# Patient Record
Sex: Female | Born: 2000 | Race: White | Hispanic: No | Marital: Single | Smoking: Never smoker
Health system: Southern US, Community
[De-identification: ages and names within clinical notes are randomized; demographics above are authoritative.]

---
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
## 2022-06-18 DIAGNOSIS — F331 Major depressive disorder, recurrent, moderate: Secondary | ICD-10-CM | POA: Diagnosis not present

## 2022-06-21 DIAGNOSIS — F988 Other specified behavioral and emotional disorders with onset usually occurring in childhood and adolescence: Secondary | ICD-10-CM | POA: Diagnosis not present

## 2022-06-28 DIAGNOSIS — F331 Major depressive disorder, recurrent, moderate: Secondary | ICD-10-CM | POA: Diagnosis not present

## 2022-07-04 DIAGNOSIS — F4011 Social phobia, generalized: Secondary | ICD-10-CM | POA: Diagnosis not present

## 2022-07-04 DIAGNOSIS — F5105 Insomnia due to other mental disorder: Secondary | ICD-10-CM | POA: Diagnosis not present

## 2022-07-04 DIAGNOSIS — F902 Attention-deficit hyperactivity disorder, combined type: Secondary | ICD-10-CM | POA: Diagnosis not present

## 2022-07-04 DIAGNOSIS — F411 Generalized anxiety disorder: Secondary | ICD-10-CM | POA: Diagnosis not present

## 2022-07-04 DIAGNOSIS — F330 Major depressive disorder, recurrent, mild: Secondary | ICD-10-CM | POA: Diagnosis not present

## 2022-07-16 DIAGNOSIS — F331 Major depressive disorder, recurrent, moderate: Secondary | ICD-10-CM | POA: Diagnosis not present

## 2022-07-17 DIAGNOSIS — F4011 Social phobia, generalized: Secondary | ICD-10-CM | POA: Diagnosis not present

## 2022-07-17 DIAGNOSIS — F330 Major depressive disorder, recurrent, mild: Secondary | ICD-10-CM | POA: Diagnosis not present

## 2022-07-17 DIAGNOSIS — F902 Attention-deficit hyperactivity disorder, combined type: Secondary | ICD-10-CM | POA: Diagnosis not present

## 2022-07-17 DIAGNOSIS — F5105 Insomnia due to other mental disorder: Secondary | ICD-10-CM | POA: Diagnosis not present

## 2022-08-07 DIAGNOSIS — F331 Major depressive disorder, recurrent, moderate: Secondary | ICD-10-CM | POA: Diagnosis not present

## 2022-08-14 DIAGNOSIS — F902 Attention-deficit hyperactivity disorder, combined type: Secondary | ICD-10-CM | POA: Diagnosis not present

## 2022-08-14 DIAGNOSIS — F4011 Social phobia, generalized: Secondary | ICD-10-CM | POA: Diagnosis not present

## 2022-08-14 DIAGNOSIS — F330 Major depressive disorder, recurrent, mild: Secondary | ICD-10-CM | POA: Diagnosis not present

## 2022-08-14 DIAGNOSIS — F5105 Insomnia due to other mental disorder: Secondary | ICD-10-CM | POA: Diagnosis not present

## 2022-08-30 ENCOUNTER — Ambulatory Visit: Payer: BC Managed Care – PPO | Admitting: Cardiology

## 2022-08-30 DIAGNOSIS — Z03818 Encounter for observation for suspected exposure to other biological agents ruled out: Secondary | ICD-10-CM | POA: Diagnosis not present

## 2022-08-30 DIAGNOSIS — R6889 Other general symptoms and signs: Secondary | ICD-10-CM | POA: Diagnosis not present

## 2022-09-04 DIAGNOSIS — F331 Major depressive disorder, recurrent, moderate: Secondary | ICD-10-CM | POA: Diagnosis not present

## 2022-09-10 DIAGNOSIS — R002 Palpitations: Secondary | ICD-10-CM | POA: Insufficient documentation

## 2022-09-10 NOTE — Progress Notes (Unsigned)
Cardiology Office Note  Date:  09/11/2022   ID:  Caragh, Gasper 25-Mar-2001, MRN [id number]  PCP:  Kassie Mends, MD   Chief Complaint  Patient presents with   New Patient (Initial Visit)    Ref by Dr. Nicolasa Ducking for tachycardia and fluttering in chest. Patient is taking Vyvanse and needs to discuss taking this with having tachycardia. Medications reviewed by the patient verblaly.     HPI:  [name] is a 21 year old woman with past medical history of ADD/fatigue Who presents by referral from Dr. 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 the heat When she gets too hot or exercises and gets overheated, will appreciate faster heartbeat, fluttering  Rare heart rate issues at rest, worse with stress Happening less now, has changed living situation Better since moving dorms, has single room, 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  PSH:    Past Surgical History:  Procedure Laterality Date   APPENDECTOMY  09/01/2017   LAPAROSCOPIC APPENDECTOMY N/A 09/01/2017   Procedure: APPENDECTOMY LAPAROSCOPIC;  Surgeon: Stanford Scotland, MD;  Location: [place];  Service: General;  Laterality: N/A;    Current Outpatient Medications   Medication Sig Dispense Refill   albuterol (VENTOLIN HFA) 108 (90 Base) MCG/ACT inhaler Inhale into the lungs.     lisdexamfetamine (VYVANSE) 50 MG capsule Take 50 mg by mouth daily.     sertraline (ZOLOFT) 50 MG tablet Take by mouth.     lamoTRIgine (LAMICTAL) 25 MG tablet Take 1 tablet (25 mg total) by mouth daily. (Patient not taking: Reported on 09/11/2022) 30 tablet 0   VYVANSE 40 MG capsule Take 40 mg by mouth every morning. (Patient not taking: Reported on 09/11/2022)  0   No current facility-administered medications for this visit.     Allergies:   Amoxicillin-pot clavulanate and Cat hair extract   Social History:  The patient  reports that she has never smoked. She has never used smokeless tobacco. She reports that she does not drink alcohol and does not use drugs.   Family History:   family history includes Heart disease (age of onset: 38) in her father; Hyperlipidemia in her mother; Hypertension in her mother.    Review of Systems: Review of Systems  Constitutional: Negative.   HENT: Negative.    Respiratory: Negative.    Cardiovascular: Negative.   Gastrointestinal: Negative.   Musculoskeletal: Negative.   Neurological: Negative.   Psychiatric/Behavioral: Negative.    All other systems reviewed and are negative.   PHYSICAL EXAM: VS:  BP 110/72 (BP Location: Left Arm, Patient Position: Sitting, Cuff Size: Normal)   Pulse 82   Ht 5' (1.524 m)   Wt 190 lb 4 oz (86.3 kg)   SpO2 99%  BMI 37.16 kg/m  , BMI Body mass index is 37.16 kg/m. GEN: Well nourished, well developed, in no acute distress HEENT: normal Neck: no JVD, carotid bruits, or masses Cardiac: RRR; no murmurs, rubs, or gallops,no edema  Respiratory:  clear to auscultation bilaterally, normal work of breathing GI: soft, nontender, nondistended, + BS MS: no deformity or atrophy Skin: warm and dry, no rash Neuro:  Strength and sensation are intact Psych: euthymic mood, full affect   Recent Labs: No  results found for requested labs within last 365 days.    Lipid Panel Lab Results  Component Value Date   CHOL 130 11/23/2017   HDL 41 11/23/2017   LDLCALC 79 11/23/2017   TRIG 51 11/23/2017      Wt Readings from Last 3 Encounters:  09/11/22 190 lb 4 oz (86.3 kg)  05/24/19 135 lb 11.2 oz (61.6 kg) (74 %, Z= 0.64)*  11/20/17 133 lb (60.3 kg) (76 %, Z= 0.71)*   * Growth percentiles are based on CDC (Girls, 2-20 Years) data.       ASSESSMENT AND PLAN:  Problem List Items Addressed This Visit     Palpitations   Relevant Orders   EKG 12-Lead   LONG TERM MONITOR (3-14 DAYS)   ADD (attention deficit disorder) without hyperactivity   Relevant Orders   EKG 12-Lead   Other Visit Diagnoses     Paroxysmal tachycardia (HCC)    -  Primary   Relevant Orders   EKG 12-Lead   LONG TERM MONITOR (3-14 DAYS)      Paroxysmal tachycardia/palpitations Likely situational in the setting of getting overheated with band camp while practicing in the heat of the day Also seems to present with she gets overheated with exercise Symptoms dramatically improved after changing her living situation and stopping and practice in the heat We have recommended she wear a Zio monitor to rule out arrhythmia Discussed potentially using short acting beta-blocker such as propranolol as needed for any breakthrough tachypalpitations not associated with stressful situations or getting overheated.  She will call us if she has such symptoms requiring the prescription Normal clinical exam, normal EKG For now recommended she stay on her ADD medication which she has been on for 10 years.  No cardiac contraindication   Total encounter time more than 60 minutes  Greater than 50% was spent in counseling and coordination of care with the patient  Patient seen in consultation for Dr. Maryruth Bun who referred back to her office for ongoing care of the issues detailed above  Signed, Dossie Arbour, M.D., Ph.D. [facility] [place], [place] [phone]

## 2022-09-11 ENCOUNTER — Ambulatory Visit: Payer: BC Managed Care – PPO | Attending: Cardiology | Admitting: Cardiovascular Disease

## 2022-09-11 ENCOUNTER — Encounter: Payer: Self-pay | Admitting: Cardiovascular Disease

## 2022-09-11 ENCOUNTER — Ambulatory Visit: Payer: BC Managed Care – PPO

## 2022-09-11 VITALS — BP 110/72 | HR 82 | Ht 60.0 in | Wt 190.2 lb

## 2022-09-11 DIAGNOSIS — R002 Palpitations: Secondary | ICD-10-CM

## 2022-09-11 DIAGNOSIS — I479 Paroxysmal tachycardia, unspecified: Secondary | ICD-10-CM

## 2022-09-11 DIAGNOSIS — F988 Other specified behavioral and emotional disorders with onset usually occurring in childhood and adolescence: Secondary | ICD-10-CM

## 2022-09-11 NOTE — Patient Instructions (Addendum)
Medication Instructions:  No changes  If you need a refill on your cardiac medications before your next appointment, please call your pharmacy.   Lab work: No new labs needed  Testing/Procedures:  ZIO XT- Long Term Monitor Instructions  Your physician has requested you wear a ZIO patch monitor for 14 days.  This is a single patch monitor. [employer] supplies one patch monitor per enrollment. Additional stickers are not available. Please do not apply patch if you will be having a Nuclear Stress Test,  Echocardiogram, Cardiac CT, MRI, or Chest Xray during the period you would be wearing the  monitor. The patch cannot be worn during these tests. You cannot remove and re-apply the  ZIO XT patch monitor.  Your ZIO patch monitor will be mailed 3 day USPS to your address on file. It may take 3-5 days  to receive your monitor after you have been enrolled.  Once you have received your monitor, please review the enclosed instructions. Your monitor  has already been registered assigning a specific monitor serial # to you.  Billing and Patient Assistance Program Information  We have supplied [employer] with any of your insurance information on file for billing purposes. [employer] offers a sliding scale Patient Assistance Program for patients that do not have  insurance, or whose insurance does not completely cover the cost of the ZIO monitor.  You must apply for the Patient Assistance Program to qualify for this discounted rate.  To apply, please call [employer] at [phone], select option 4, select option 2, ask to apply for  Patient Assistance Program. Meredeth Ide will ask your household income, and how many people  are in your household. They will quote your out-of-pocket cost based on that information.  [employer] will also be able to set up a 31-month, interest-free payment plan if needed.  Applying the monitor   Shave hair from upper left chest.  Hold abrader disc by orange tab. Rub abrader in 40  strokes over the upper left chest as  indicated in your monitor instructions.  Clean area with 4 enclosed alcohol pads. Let dry.  Apply patch as indicated in monitor instructions. Patch will be placed under collarbone on left  side of chest with arrow pointing upward.  Rub patch adhesive wings for 2 minutes. Remove white label marked "1". Remove the white  label marked "2". Rub patch adhesive wings for 2 additional minutes.  While looking in a mirror, press and release button in center of patch. A small green light will  flash 3-4 times. This will be your only indicator that the monitor has been turned on.  Do not shower for the first 24 hours. You may shower after the first 24 hours.  Press the button if you feel a symptom. You will hear a small click. Record Date, Time and  Symptom in the Patient Logbook.  When you are ready to remove the patch, follow instructions on the last 2 pages of Patient  Logbook. Stick patch monitor onto the last page of Patient Logbook.  Place Patient Logbook in the blue and white box. Use locking tab on box and tape box closed  securely. The blue and white box has prepaid postage on it. Please place it in the mailbox as  soon as possible. Your physician should have your test results approximately 7 days after the  monitor has been mailed back to [facility].  Call [facility] Customer Care at [phone] if you have questions regarding  your ZIO XT patch  monitor. Call them immediately if you see an orange light blinking on your  monitor.  If your monitor falls off in less than 4 days, contact our Monitor department at [phone].  If your monitor becomes loose or falls off after 4 days call [employer] at [phone] for  suggestions on securing your monitor   Follow-Up: At [facility], you and your health needs are our priority.  As part of our continuing mission to provide you with exceptional heart care, we have created designated Provider Care  Teams.  These Care Teams include your primary Cardiologist (physician) and Advanced Practice Providers (APPs -  Physician Assistants and Nurse Practitioners) who all work together to provide you with the care you need, when you need it.  You will need a follow up appointment as needed  Providers on your designated Care Team:   Murray Hodgkins, NP Christell Faith, PA-C Cadence Kathlen Mody, [place]  COVID-19 Vaccine Information can be found at: [email or web address].uk For questions related to vaccine distribution or appointments, please email vaccine@ [email or web address] or call [phone].

## 2022-09-12 ENCOUNTER — Telehealth: Payer: Self-pay | Admitting: Cardiovascular Disease

## 2022-09-12 NOTE — Telephone Encounter (Signed)
Pt mother calling stating she cannot afford heart monitor and does not want it to be sent out.

## 2022-09-12 NOTE — Telephone Encounter (Signed)
Spoke with the patient's mother who states that the heart monitor is going to be difficult for them to afford. She states that she would like to know whether Dr. Rockey Situ thinks that it is necessary for her daughter to wear the monitor. She states that she understood that part of the reason for patient's cardiac evaluation was to be cleared by Dr. Nicolasa Ducking who is prescribing the patient Vyvanse. Mother is very tearful and concerned. She states that if Dr. Rockey Situ thinks that the heart monitor is necessary that they will figure out a way to pay for it because she would hate for something to be wrong with her daughter and they not know because of not going through with the heart monitor. Mother just wants to make sure that Dr. Rockey Situ would be ordering the heart monitor regardless of request from Dr. Nicolasa Ducking or not.   I have also reassured her that they will not be charged for the monitor until it is worn so if it is received in the mail to hold onto it until we hear back from Dr. Rockey Situ. She verbalized understanding.

## 2022-09-14 NOTE — Telephone Encounter (Signed)
Cassandra Merritts, MD  P Cv Div [place] Triage Caller: Unspecified (2 days ago,  5:67 AM) It is certainly not mandatory that she wear a Zio monitor  It is encouraging she has been feeling well recently with no significant symptoms of tachycardia  Prior symptoms most likely sinus tachycardia in the setting of stress such as getting overheated  An alternate way that she could monitor for cardiac arrhythmia would be purchase a cheaper device such as a cardio mobile device  [email or web address].tn  When having symptoms could record a EKG rhythm strip and send [place] the data through [place]

## 2022-09-14 NOTE — Telephone Encounter (Signed)
Spoke with the patients mother and made her aware of Dr. Donivan Scull recommendation.  Patient's mother sts that they will purchase the recommended Kardiamobile device and have the patient keep it with her to record symptoms when they happen and signed the EKG throught mychart for Dr. Candis Musa to review. Patient is not currently signed up with mychart. Mychart sign up link sent to the pt mobile number provided by the pt mother [phone].  Adv the pt mother that if the zio is received in the mail, they can go ahead and return it to [facility]. Patients mother verbalized understanding and voiced appreciation for the assistance.

## 2022-09-27 DIAGNOSIS — F331 Major depressive disorder, recurrent, moderate: Secondary | ICD-10-CM | POA: Diagnosis not present

## 2022-10-24 DIAGNOSIS — F331 Major depressive disorder, recurrent, moderate: Secondary | ICD-10-CM | POA: Diagnosis not present

## 2022-11-14 DIAGNOSIS — F331 Major depressive disorder, recurrent, moderate: Secondary | ICD-10-CM | POA: Diagnosis not present

## 2022-11-21 DIAGNOSIS — F331 Major depressive disorder, recurrent, moderate: Secondary | ICD-10-CM | POA: Diagnosis not present

## 2022-12-06 DIAGNOSIS — F4011 Social phobia, generalized: Secondary | ICD-10-CM | POA: Diagnosis not present

## 2022-12-06 DIAGNOSIS — F5105 Insomnia due to other mental disorder: Secondary | ICD-10-CM | POA: Diagnosis not present

## 2022-12-06 DIAGNOSIS — F331 Major depressive disorder, recurrent, moderate: Secondary | ICD-10-CM | POA: Diagnosis not present

## 2022-12-06 DIAGNOSIS — F330 Major depressive disorder, recurrent, mild: Secondary | ICD-10-CM | POA: Diagnosis not present

## 2022-12-06 DIAGNOSIS — F902 Attention-deficit hyperactivity disorder, combined type: Secondary | ICD-10-CM | POA: Diagnosis not present

## 2023-01-02 DIAGNOSIS — F331 Major depressive disorder, recurrent, moderate: Secondary | ICD-10-CM | POA: Diagnosis not present

## 2023-01-31 DIAGNOSIS — F331 Major depressive disorder, recurrent, moderate: Secondary | ICD-10-CM | POA: Diagnosis not present

## 2023-02-19 DIAGNOSIS — F331 Major depressive disorder, recurrent, moderate: Secondary | ICD-10-CM | POA: Diagnosis not present

## 2023-03-05 DIAGNOSIS — F902 Attention-deficit hyperactivity disorder, combined type: Secondary | ICD-10-CM | POA: Diagnosis not present

## 2023-03-05 DIAGNOSIS — F330 Major depressive disorder, recurrent, mild: Secondary | ICD-10-CM | POA: Diagnosis not present

## 2023-03-19 DIAGNOSIS — F331 Major depressive disorder, recurrent, moderate: Secondary | ICD-10-CM | POA: Diagnosis not present

## 2023-03-28 DIAGNOSIS — F330 Major depressive disorder, recurrent, mild: Secondary | ICD-10-CM | POA: Diagnosis not present

## 2023-03-28 DIAGNOSIS — F902 Attention-deficit hyperactivity disorder, combined type: Secondary | ICD-10-CM | POA: Diagnosis not present

## 2023-04-02 DIAGNOSIS — F331 Major depressive disorder, recurrent, moderate: Secondary | ICD-10-CM | POA: Diagnosis not present

## 2023-04-25 DIAGNOSIS — F331 Major depressive disorder, recurrent, moderate: Secondary | ICD-10-CM | POA: Diagnosis not present

## 2023-05-21 DIAGNOSIS — F331 Major depressive disorder, recurrent, moderate: Secondary | ICD-10-CM | POA: Diagnosis not present

## 2023-06-24 DIAGNOSIS — F902 Attention-deficit hyperactivity disorder, combined type: Secondary | ICD-10-CM | POA: Diagnosis not present

## 2023-06-24 DIAGNOSIS — F4011 Social phobia, generalized: Secondary | ICD-10-CM | POA: Diagnosis not present

## 2023-06-24 DIAGNOSIS — F5105 Insomnia due to other mental disorder: Secondary | ICD-10-CM | POA: Diagnosis not present

## 2023-06-24 DIAGNOSIS — F330 Major depressive disorder, recurrent, mild: Secondary | ICD-10-CM | POA: Diagnosis not present

## 2023-07-01 DIAGNOSIS — F902 Attention-deficit hyperactivity disorder, combined type: Secondary | ICD-10-CM | POA: Diagnosis not present

## 2023-07-01 DIAGNOSIS — F330 Major depressive disorder, recurrent, mild: Secondary | ICD-10-CM | POA: Diagnosis not present

## 2023-08-05 DIAGNOSIS — F902 Attention-deficit hyperactivity disorder, combined type: Secondary | ICD-10-CM | POA: Diagnosis not present

## 2023-08-05 DIAGNOSIS — F330 Major depressive disorder, recurrent, mild: Secondary | ICD-10-CM | POA: Diagnosis not present

## 2023-08-20 DIAGNOSIS — U071 COVID-19: Secondary | ICD-10-CM | POA: Diagnosis not present

## 2023-10-07 DIAGNOSIS — F902 Attention-deficit hyperactivity disorder, combined type: Secondary | ICD-10-CM | POA: Diagnosis not present

## 2023-10-07 DIAGNOSIS — F39 Unspecified mood [affective] disorder: Secondary | ICD-10-CM | POA: Diagnosis not present

## 2023-10-09 DIAGNOSIS — F4011 Social phobia, generalized: Secondary | ICD-10-CM | POA: Diagnosis not present

## 2023-10-09 DIAGNOSIS — F39 Unspecified mood [affective] disorder: Secondary | ICD-10-CM | POA: Diagnosis not present

## 2023-10-25 DIAGNOSIS — F902 Attention-deficit hyperactivity disorder, combined type: Secondary | ICD-10-CM | POA: Diagnosis not present

## 2023-10-25 DIAGNOSIS — F39 Unspecified mood [affective] disorder: Secondary | ICD-10-CM | POA: Diagnosis not present

## 2023-11-20 DIAGNOSIS — F39 Unspecified mood [affective] disorder: Secondary | ICD-10-CM | POA: Diagnosis not present

## 2023-11-20 DIAGNOSIS — F902 Attention-deficit hyperactivity disorder, combined type: Secondary | ICD-10-CM | POA: Diagnosis not present

## 2024-01-24 DIAGNOSIS — F39 Unspecified mood [affective] disorder: Secondary | ICD-10-CM | POA: Diagnosis not present

## 2024-01-24 DIAGNOSIS — F902 Attention-deficit hyperactivity disorder, combined type: Secondary | ICD-10-CM | POA: Diagnosis not present

## 2024-02-21 DIAGNOSIS — F902 Attention-deficit hyperactivity disorder, combined type: Secondary | ICD-10-CM | POA: Diagnosis not present

## 2024-02-21 DIAGNOSIS — F39 Unspecified mood [affective] disorder: Secondary | ICD-10-CM | POA: Diagnosis not present

## 2024-03-12 DIAGNOSIS — F39 Unspecified mood [affective] disorder: Secondary | ICD-10-CM | POA: Diagnosis not present

## 2024-03-12 DIAGNOSIS — F902 Attention-deficit hyperactivity disorder, combined type: Secondary | ICD-10-CM | POA: Diagnosis not present

## 2024-04-03 DIAGNOSIS — F902 Attention-deficit hyperactivity disorder, combined type: Secondary | ICD-10-CM | POA: Diagnosis not present

## 2024-04-03 DIAGNOSIS — F39 Unspecified mood [affective] disorder: Secondary | ICD-10-CM | POA: Diagnosis not present

## 2024-04-06 DIAGNOSIS — F39 Unspecified mood [affective] disorder: Secondary | ICD-10-CM | POA: Diagnosis not present

## 2024-04-06 DIAGNOSIS — F902 Attention-deficit hyperactivity disorder, combined type: Secondary | ICD-10-CM | POA: Diagnosis not present

## 2024-04-14 DIAGNOSIS — F902 Attention-deficit hyperactivity disorder, combined type: Secondary | ICD-10-CM | POA: Diagnosis not present

## 2024-04-14 DIAGNOSIS — F39 Unspecified mood [affective] disorder: Secondary | ICD-10-CM | POA: Diagnosis not present

## 2024-05-03 ENCOUNTER — Encounter: Payer: Self-pay | Admitting: *Deleted

## 2024-05-03 ENCOUNTER — Other Ambulatory Visit: Payer: Self-pay

## 2024-05-03 ENCOUNTER — Emergency Department
Admission: EM | Admit: 2024-05-03 | Discharge: 2024-05-04 | Disposition: A | Discharge status: Home / Self Care | Attending: Emergency Medicine | Admitting: Emergency Medicine

## 2024-05-03 DIAGNOSIS — Z630 Problems in relationship with spouse or partner: Secondary | ICD-10-CM | POA: Insufficient documentation

## 2024-05-03 DIAGNOSIS — F411 Generalized anxiety disorder: Secondary | ICD-10-CM | POA: Diagnosis not present

## 2024-05-03 DIAGNOSIS — R45851 Suicidal ideations: Secondary | ICD-10-CM | POA: Diagnosis not present

## 2024-05-03 DIAGNOSIS — Z1389 Encounter for screening for other disorder: Secondary | ICD-10-CM | POA: Diagnosis not present

## 2024-05-03 DIAGNOSIS — F909 Attention-deficit hyperactivity disorder, unspecified type: Secondary | ICD-10-CM | POA: Insufficient documentation

## 2024-05-03 DIAGNOSIS — F331 Major depressive disorder, recurrent, moderate: Secondary | ICD-10-CM | POA: Diagnosis not present

## 2024-05-03 DIAGNOSIS — Z9183 Wandering in diseases classified elsewhere: Secondary | ICD-10-CM | POA: Diagnosis not present

## 2024-05-03 LAB — COMPREHENSIVE METABOLIC PANEL WITH GFR
ALT: 16 U/L (ref 0–44)
AST: 16 U/L (ref 15–41)
Albumin: 4.6 g/dL (ref 3.5–5.0)
Alkaline Phosphatase: 82 U/L (ref 38–126)
Anion gap: 7 (ref 5–15)
BUN: 14 mg/dL (ref 6–20)
CO2: 28 mmol/L (ref 22–32)
Calcium: 9.8 mg/dL (ref 8.9–10.3)
Chloride: 106 mmol/L (ref 98–111)
Creatinine, Ser: 0.82 mg/dL (ref 0.44–1.00)
GFR, Estimated: >60 mL/min (ref >60)
Glucose, Bld: 113 mg/dL — ABNORMAL HIGH (ref 70–99)
Potassium: 4.1 mmol/L (ref 3.5–5.1)
Sodium: 141 mmol/L (ref 135–145)
Total Bilirubin: 0.6 mg/dL (ref 0.0–1.2)
Total Protein: 8.2 g/dL — ABNORMAL HIGH (ref 6.5–8.1)

## 2024-05-03 LAB — CBC
HCT: 45.2 % (ref 36.0–46.0)
Hemoglobin: 14.8 g/dL (ref 12.0–15.0)
MCH: 28.2 pg (ref 26.0–34.0)
MCHC: 32.7 g/dL (ref 30.0–36.0)
MCV: 86.1 fL (ref 80.0–100.0)
Platelets: 333 10*3/uL (ref 150–400)
RBC: 5.25 MIL/uL — ABNORMAL HIGH (ref 3.87–5.11)
RDW: 12.4 % (ref 11.5–15.5)
WBC: 10 10*3/uL (ref 4.0–10.5)
nRBC: 0 % (ref 0.0–0.2)

## 2024-05-03 LAB — ETHANOL: Alcohol, Ethyl (B): <15 mg/dL (ref <15)

## 2024-05-03 NOTE — ED Triage Notes (Signed)
 Pt ambulatory to triage   pt reports depression and SI for 2 weeks.  Pt denies etoh use.  Pt reports uses THC.  Pt denies hallucinations.  Pt calm and cooperative

## 2024-05-03 NOTE — ED Notes (Signed)
 Green sweat pants Blue tee shirt Tan bra Pink/white slippers Pink stripe bag Tan underwear

## 2024-05-03 NOTE — ED Provider Notes (Signed)
 [facility] Provider Note    Event Date/Time   First MD Initiated Contact with Patient 05/03/24 2257     (approximate)   History   Psychiatric Evaluation   HPI  Cassandra Reed is a 23 year old female presenting to the ER for evaluation of passive SI.  Patient reports worsening mood over the past several weeks.  Reports increased cannabis use.  Left school because of her worsening mood and returned home recently.  Today had multiple episodes where she felt overwhelmed and could not stop crying.  Reports passive suicidal thoughts, but denies specific plan.  Does have history of prior suicide attempt.  Reports she is on Vyvanse, sertraline, and Wellbutrin.  Wellbutrin was started about a month ago, no other recent medication changes.  Reports taking her medications appropriately.      Physical Exam   Triage Vital Signs: ED Triage Vitals  Encounter Vitals Group     BP 05/03/24 2232 110/77     Systolic BP Percentile --      Diastolic BP Percentile --      Pulse Rate 05/03/24 2232 72     Resp 05/03/24 2232 18     Temp 05/03/24 2232 98.4 F (36.9 C)     Temp Source 05/03/24 2232 Oral     SpO2 05/03/24 2232 98 %     Weight 05/03/24 2228 190 lb (86.2 kg)     Height 05/03/24 2228 4\' 11"  (1.499 m)     Head Circumference --      Peak Flow --      Pain Score 05/03/24 2228 0     Pain Loc --      Pain Education --      Exclude from Growth Chart --     Most recent vital signs: Vitals:   05/03/24 2232  BP: 110/77  Pulse: 72  Resp: 18  Temp: 98.4 F (36.9 C)  SpO2: 98%     General: Awake, interactive  CV:  Regular rate, good peripheral perfusion.  Resp:  Unlabored respirations.  Abd:  Nondistended.  Neuro:  Symmetric facial movement, fluid speech   ED Results / Procedures / Treatments   Labs (all labs ordered are listed, but only abnormal results are displayed) Labs Reviewed  COMPREHENSIVE METABOLIC PANEL WITH GFR - Abnormal; Notable  for the following components:      Result Value   Glucose, Bld 113 (*)    Total Protein 8.2 (*)    All other components within normal limits  CBC - Abnormal; Notable for the following components:   RBC 5.25 (*)    All other components within normal limits  ETHANOL  URINE DRUG SCREEN, QUALITATIVE ([facility] ONLY)  POC URINE PREG, ED     EKG EKG independently reviewed interpreted by myself (ER attending) demonstrates:    RADIOLOGY Imaging independently reviewed and interpreted by myself demonstrates:   Formal Radiology Read:  No results found.  PROCEDURES:  Critical Care performed: No  Procedures   MEDICATIONS ORDERED IN ED: Medications - No data to display   IMPRESSION / MDM / ASSESSMENT AND PLAN / ED COURSE  I reviewed the triage vital signs and the nursing notes.  Differential diagnosis includes, but is not limited to, primary psychiatric disorder, substance-induced mood disorder, acute stress response  Patient's presentation is most consistent with acute presentation with potential threat to life or bodily function.  23 year old female presenting with passive suicidal ideation.  Stable vitals on presentation.  No acute  medical complaints.  Will consult psychiatry and TTS.  No active plan, do not feel there is currently an indication for IVC.  The patient has been placed in psychiatric observation due to the need to provide a safe environment for the patient while obtaining psychiatric consultation and evaluation, as well as ongoing medical and medication management to treat the patient's condition.  The patient has not been placed under full IVC at this time.      FINAL CLINICAL IMPRESSION(S) / ED DIAGNOSES   Final diagnoses:  Suicidal ideation     Rx / DC Orders   ED Discharge Orders     None        Note:  This document was prepared using Dragon voice recognition software and may include unintentional dictation errors.   Claria Crofts, MD 05/03/24  [phone]

## 2024-05-03 NOTE — Consult Note (Signed)
 [facility] Psychiatric Consult Initial  Patient Name: .Cassandra Reed  MRN: [id number]  DOB: 2001/04/20  Consult Order details:  Orders (From admission, onward)     Start     Ordered   05/03/24 2323  CONSULT TO CALL ACT TEAM       Ordering Provider: Claria Crofts, MD  Provider:  (Not yet assigned)  Question:  Reason for Consult?  Answer:  Psych consult   05/03/24 2323   05/03/24 2323  IP CONSULT TO PSYCHIATRY       Ordering Provider: Claria Crofts, MD  Provider:  (Not yet assigned)  Question Answer Comment  Consult Timeframe ROUTINE - requires response within 24 hours   Reason for Consult? Consult for medication management   Contact phone number where the requesting provider can be reached [phone]      05/03/24 2323             Mode of Visit: In person    Psychiatry Consult Evaluation  Service Date: May 03, 2024 LOS:  LOS: 0 days  Chief Complaint Patient presents to the ED due to tearfulness, feelings of panic, and a sense of emotional overwhelm following conflict with her mother.    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 She denies active suicidal ideation, plan, or intent, and expresses insight into her emotional state and passive thoughts. She is willing to engage with outpatient care and has established follow-up with Dr. Kapoor at [facility]. Reassessment recommended in the morning to ensure stability and allow for further observation of mood. Outpatient care is appropriate unless future reassessment suggests escalation.  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 Bradford Cadet) assess efficacy of Wellbutrin dosage adjustment to target persistent depressive symptoms  Consider psychotherapy focused on mood regulation and interpersonal stress  Encourage full disclosure to outpatient provider about extent of tearfulness, passive SI, and insight around emotional downplaying  ## Medical Decision Making Capacity: Not specifically addressed in this encounter  ## Further Work-up:  -- Lab Orders         Comprehensive metabolic panel         Ethanol         cbc         Urine Drug Screen, Qualitative         Pregnancy, urine      ## Disposition:-- Reassess in the AM  ## Behavioral / Environmental: - No specific recommendations at this time.     ## Safety and Observation Level:  - Based on my clinical evaluation, I estimate the patient to be at low risk of self harm in the current setting. - At this time, we recommend  routine. This decision is based on my review of the chart including patient's history and current presentation, interview of the patient, mental status examination, and consideration of suicide risk including evaluating suicidal ideation, plan, intent, suicidal or  self-harm behaviors, risk factors, and protective factors. This judgment is based on our ability to directly address suicide risk, implement suicide prevention strategies, and develop a safety plan while the patient is in the clinical setting. Please contact our team if there is a concern that risk level has changed.  CSSR Risk Category:C-SSRS RISK CATEGORY: Moderate Risk  Suicide Risk Assessment: Patient has following modifiable risk factors  for suicide: active suicidal ideation and under treated depression , which we are addressing by reassessing in the AM. Patient has the following protective factors against suicide: Supportive family and Frustration tolerance  Thank you for this consult request. Recommendations have been communicated to the primary team.  We will recommend reassess in the am at this time.   Al Alias, NP       History of Present Illness  Cassandra Reed 23 year old female presents for psychiatric evaluation due to increasing emotional instability marked by crying spells, panic, and feelings of being overwhelmed. She reports a recent argument with her mother who encouraged her to present to the hospital. The patient acknowledges a history of depression since adolescence and reports a past suicide attempt at age 35 but denies any active suicidal ideation, plan, or means currently. She describes passive suicidal ideation with intrusive, self-questioning thoughts but states she is deterred by fear and a previous adverse reaction from her prior overdose. The patient is currently prescribed Sertraline  and recently started on Wellbutrin through Dr. Bradford Cadet at [facility]. She acknowledges that she may not have fully communicated her depressive symptoms during her outpatient visits. She demonstrates good insight, is articulate, and expresses a willingness to continue outpatient treatment.  Collateral information:  Mariah Shines (Mother) [phone] Mother expressed concerns about the pt's erratic moods and behaviors. Mother reported that the pt often expresses having feelings of worthlessness or hopelessness. Mother stated that the pt "has lost herself". Mother reported that the pt is taking a year off from college due to poor performance. Mother reported that the pt experienced bullying and had traumatic social experiences during her college career. Mother feels that the pt could use help.   Review of Systems   Psychiatric/Behavioral:  Positive for depression and suicidal ideas.   All other systems reviewed and are negative.    Psychiatric and Social History  Psychiatric History:  Information collected from patient and mom  History of depression since adolescence  One suicide attempt at age 52  No psychiatric hospitalizations reported  Currently engaged in outpatient psychiatric care with Dr. Willaim Harlem  Social History   Socioeconomic History   Marital status: Single    Spouse name: Not on file   Number of children: Not on file   Years of education: Not on file   Highest education level: Not on file  Occupational History   Not on file  Tobacco Use   Smoking status: Never   Smokeless tobacco: Never  Vaping Use   Vaping status: Every Day  Substance and Sexual Activity   Alcohol use: No   Drug use: No   Sexual activity: Never    Birth control/protection: Abstinence  Other Topics Concern   Not on file  Social History Narrative   Not on file   Social Drivers of Health   Financial Resource Strain: Not on file  Food Insecurity: Not on file  Transportation Needs: Not on file  Physical Activity: Not on file  Stress: Not on file  Social Connections: Not on file  Intimate Partner Violence: Not on file  Exam Findings  Physical Exam:  Vital Signs:  Temp:  [98.4 F (36.9 C)] 98.4 F (36.9 C) (05/18 2232) Pulse Rate:  [72] 72 (05/18 2232) Resp:  [18] 18 (05/18 2232) BP: (110)/(77) 110/77 (05/18 2232) SpO2:  [98 %] 98 % (05/18 2232) Weight:  [86.2 kg] 86.2 kg (05/18 2228) Blood pressure 110/77, pulse 72, temperature 98.4 F (36.9 C), temperature source Oral, resp. rate 18, height 4\' 11"  (1.499 m), weight 86.2 kg, last menstrual period 04/26/2024, SpO2 98%. Body mass index is 38.38 kg/m.  Physical Exam Vitals and nursing note reviewed.  Constitutional:      Appearance: Normal appearance.  HENT:     Head: Normocephalic and atraumatic.     Nose: Nose normal.  Eyes:      Pupils: Pupils are equal, round, and reactive to light.  Pulmonary:     Effort: Pulmonary effort is normal.  Musculoskeletal:        General: Normal range of motion.     Cervical back: Normal range of motion.  Skin:    General: Skin is dry.  Neurological:     Mental Status: She is alert and oriented to person, place, and time.  Psychiatric:        Attention and Perception: Attention and perception normal.        Mood and Affect: Mood is anxious and depressed. Affect is tearful.        Speech: Speech normal.        Behavior: Behavior normal. Behavior is cooperative.        Thought Content: Thought content includes suicidal ideation. Thought content does not include suicidal plan.        Cognition and Memory: Cognition and memory normal.        Judgment: Judgment is impulsive.     Mental Status Exam: General Appearance: Casual  Orientation:  Full (Time, Place, and Person)  Memory:  Immediate;   Fair Recent;   Fair  Concentration:  Concentration: Fair and Attention Span: Fair  Recall:  Fair  Attention  Fair  Eye Contact:  Fair  Speech:  Clear and Coherent  Language:  Fair  Volume:  Normal  Mood: depressed  Affect:  Congruent and Depressed  Thought Process:  Coherent  Thought Content:  WDL  Suicidal Thoughts:  Yes.  without intent/plan  Homicidal Thoughts:  No  Judgement:  Impaired  Insight:  Fair  Psychomotor Activity:  NA  Akathisia:  NA  Fund of Knowledge:  Fair      Assets:  Manufacturing systems engineer Desire for Improvement Financial Resources/Insurance Housing Social Support Vocational/Educational  Cognition:  WNL  ADL's:  Intact  AIMS (if indicated):        Other History   These have been pulled in through the EMR, reviewed, and updated if appropriate.  Family History:  The patient's family history includes Heart disease (age of onset: 46) in her father; Hyperlipidemia in her mother; Hypertension in her mother.  Medical History: History reviewed. No  pertinent past medical history.  Surgical History: Past Surgical History:  Procedure Laterality Date   APPENDECTOMY  09/01/2017   LAPAROSCOPIC APPENDECTOMY N/A 09/01/2017   Procedure: APPENDECTOMY LAPAROSCOPIC;  Surgeon: Verlena Glenn, MD;  Location: [facility] OR;  Service: General;  Laterality: N/A;     Medications:  No current facility-administered medications for this encounter.  Current Outpatient Medications:    albuterol (VENTOLIN HFA) 108 (90 Base) MCG/ACT inhaler, Inhale into the lungs., Disp: , Rfl:    lamoTRIgine  (LAMICTAL )  25 MG tablet, Take 1 tablet (25 mg total) by mouth daily. (Patient not taking: Reported on 09/11/2022), Disp: 30 tablet, Rfl: 0   lisdexamfetamine (VYVANSE) 50 MG capsule, Take 50 mg by mouth daily., Disp: , Rfl:    sertraline  (ZOLOFT ) 50 MG tablet, Take by mouth., Disp: , Rfl:    VYVANSE 40 MG capsule, Take 40 mg by mouth every morning. (Patient not taking: Reported on 09/11/2022), Disp: , Rfl: 0  Allergies: Allergies  Allergen Reactions   Amoxicillin-Pot Clavulanate Rash   Cat Dander Nausea And Vomiting    Antwone Capozzoli Cheril Cork, NP

## 2024-05-04 DIAGNOSIS — R45851 Suicidal ideations: Secondary | ICD-10-CM

## 2024-05-04 DIAGNOSIS — F331 Major depressive disorder, recurrent, moderate: Secondary | ICD-10-CM

## 2024-05-04 LAB — URINE DRUG SCREEN, QUALITATIVE (ARMC ONLY)
Amphetamines, Ur Screen: NOT DETECTED
Barbiturates, Ur Screen: NOT DETECTED
Benzodiazepine, Ur Scrn: NOT DETECTED
Cannabinoid 50 Ng, Ur ~~LOC~~: POSITIVE — AB
Cocaine Metabolite,Ur ~~LOC~~: NOT DETECTED
MDMA (Ecstasy)Ur Screen: NOT DETECTED
Methadone Scn, Ur: NOT DETECTED
Opiate, Ur Screen: NOT DETECTED
Phencyclidine (PCP) Ur S: NOT DETECTED
Tricyclic, Ur Screen: NOT DETECTED

## 2024-05-04 LAB — PREGNANCY, URINE: Preg Test, Ur: NEGATIVE

## 2024-05-04 MED ORDER — SERTRALINE HCL 50 MG PO TABS
100.0000 mg | ORAL_TABLET | Freq: Every day | ORAL | 0 refills | Status: DC
Start: 2024-05-04 — End: 2024-05-13

## 2024-05-04 MED ORDER — HYDROXYZINE HCL 10 MG PO TABS: 10.0000 mg | ORAL_TABLET | Freq: Three times a day (TID) | ORAL | 0 refills | Status: DC | PRN

## 2024-05-04 NOTE — ED Notes (Signed)
 Pt stating she would prefer to be discharged home tonight if her mother is agreeable with that plan. Denies any plans for SI and states has no intention of acting on any SI thoughts. TTS and psych made aware.

## 2024-05-04 NOTE — BH Assessment (Signed)
 IRIS request has been placed for this patient to be seen.

## 2024-05-04 NOTE — BH Assessment (Signed)
 Comprehensive Clinical Assessment (CCA) Screening, Triage and Referral Note  05/04/2024 Cassandra Reed [id number] Recommendations for Services/Supports/Treatments: Consulted with Cassandra D., NP, who recommended pt. for continued observation and reassessment in the AM.   Cassandra Reed is a 23 year old, English speaking, Caucasian female. Pt presented to [facility] ED voluntarily. Per triage note: Pt ambulatory to triage pt reports depression and SI for 2 weeks. Pt denies etoh use. Pt reports uses THC. Pt denies hallucinations.  Pt was resting upon this writer's arrival. Pt presented clear and coherent speech. Pt was A&O X4 and pt.'s thoughts were relevant to the situation. Motor behavior was normal. Pt presented with a euthymic mood; affect was congruent. Pt had a casual appearance. Pt explained that she'd presented to the hospital due to getting upset with her mom about her possession of her vape pen. Pt admitted that she became overwhelmed and began experiencing passive SI. Pt endorsed having crying spells and erratic behavior. Pt explained that she'd decided to come for an evaluation upon her mother's suggestion. Pt reported that she receives medication management from Dr. Bradford Reed. Pt reported that she endorsed having thoughts of passive SI however, she never had a plan or intent. Pt denied having access to weapons. Pt denied current SI/HI/AV/H. Pt admitted to cannabis use earlier on 05/03/24. Pt reported daily use; however she plans to gradually decrease her usage.  Chief Complaint:  Chief Complaint  Patient presents with   Psychiatric Evaluation   Visit Diagnosis: MDD ADHD  Patient Reported Information How did you hear about us ? No data recorded What Is the Reason for Your Visit/Call Today? No data recorded How Long Has This Been Causing You Problems? No data recorded What Do You Feel Would Help You the Most Today? No data recorded  Have You Recently Had Any Thoughts About Hurting Yourself?  No data recorded Are You Planning to Commit Suicide/Harm Yourself At This time? No data recorded  Have you Recently Had Thoughts About Hurting Someone Cassandra Reed? No data recorded Are You Planning to Harm Someone at This Time? No data recorded Explanation: No data recorded  Have You Used Any Alcohol or Drugs in the Past 24 Hours? No data recorded How Long Ago Did You Use Drugs or Alcohol? No data recorded What Did You Use and How Much? No data recorded  Do You Currently Have a Therapist/Psychiatrist? No data recorded Name of Therapist/Psychiatrist: No data recorded  Have You Been Recently Discharged From Any Office Practice or Programs? No data recorded Explanation of Discharge From Practice/Program: No data recorded   CCA Screening Triage Referral Assessment Type of Contact: No data recorded Telemedicine Service Delivery:   Is this Initial or Reassessment?   Date Telepsych consult ordered in [facility]:    Time Telepsych consult ordered in [facility]:    Location of Assessment: No data recorded Provider Location: No data recorded   Collateral Involvement: No data recorded  Does Patient Have a Court Appointed Legal Guardian? No data recorded Name and Contact of Legal Guardian: No data recorded If Minor and Not Living with Parent(s), Who has Custody? No data recorded Is CPS involved or ever been involved? No data recorded Is APS involved or ever been involved? No data recorded  Patient Determined To Be At Risk for Harm To Self or Others Based on Review of Patient Reported Information or Presenting Complaint? No data recorded Method: No data recorded Availability of Means: No data recorded Intent: No data recorded Notification Required: No data recorded Additional Information for Danger  to Others Potential: No data recorded Additional Comments for Danger to Others Potential: No data recorded Are There Guns or Other Weapons in Your Home? No data recorded Types of Guns/Weapons: No data  recorded Are These Weapons Safely Secured?                            No data recorded Who Could Verify You Are Able To Have These Secured: No data recorded Do You Have any Outstanding Charges, Pending Court Dates, Parole/Probation? No data recorded Contacted To Inform of Risk of Harm To Self or Others: No data recorded  Does Patient Present under Involuntary Commitment? No data recorded   [place] of Residence: No data recorded  Patient Currently Receiving the Following Services: No data recorded  Determination of Need: No data recorded  Options For Referral: No data recorded  Disposition Recommendation per psychiatric provider: Overnight observation and reassessment.   Ryelynn Guedea R Megan Presti, LCAS

## 2024-05-04 NOTE — Consult Note (Signed)
 Cassandra Reed Consult Note  Patient Name: Cassandra Reed MRN: [id number] DOB: Jan 07, 2001 DATE OF Consult: 05/04/2024  PRIMARY PSYCHIATRIC DIAGNOSES  1.  MDD, Recurrent, Moderate 2.  GAD 3.  ADHD  RECOMMENDATIONS  Recommendations: Medication recommendations: Hydroxyzine  10-20 mg po TID PRN for anxiety/panic attack.  Increase Zoloft  back up to 100 mg po daily for depression/anxiety.  Continue Wellbutrin as prescribed Non-Medication/therapeutic recommendations: Follow up with outpatient psychiatric provider within 2 weeks.  Provide resources/referrals for outpatient therapists in the area Is inpatient psychiatric hospitalization recommended for this patient? No (Explain why): No imminent danger to self or others Is another care setting recommended for this patient? (examples may include Crisis Stabilization Unit, Residential/Recovery Treatment, ALF/SNF, Memory Care Unit)  No (Explain why): N/A From a psychiatric perspective, is this patient appropriate for discharge to an outpatient setting/resource or other less restrictive environment for continued care?  Yes (Explain why): No imminent danger to self or others, has outpatient psychiatric provider Follow-Up Reed C/L services: We will sign off for now. Please re-consult our service if needed for any concerning changes in the patient's condition, discharge planning, or questions. Communication: Treatment team members (and family members if applicable) who were involved in treatment/care discussions and planning, and with whom we spoke or engaged with via secure text/chat, include the following: Allyson, RN; Demetria, Counselor; Dr. Peggi Bowels  Thank you for involving us  in the care of this patient. If you have any additional questions or concerns, please call [phone] and ask for me or the provider on-call.  Reed ATTESTATION & CONSENT  As the provider for this telehealth consult, I attest that I verified the patient's  identity using two separate identifiers, introduced myself to the patient, provided my credentials, disclosed my location, and performed this encounter via a HIPAA-compliant, real-time, face-to-face, two-way, interactive audio and video platform and with the full consent and agreement of the patient (or guardian as applicable.)  Patient physical location: [facility]. Telehealth provider physical location: home office in state of [place] .  Video start time: 1205 (Central Time) Video end time: 1223 (Central Time)  IDENTIFYING DATA  Cassandra Reed is a 23 y.o. year-old female for whom a psychiatric consultation has been ordered by the primary provider. The patient was identified using two separate identifiers.  CHIEF COMPLAINT/REASON FOR CONSULT  Anxiety, depression, SI  HISTORY OF PRESENT ILLNESS (HPI)  The patient presented to the ED with c/o anxiety, depression, SI.  Pt reportedly presented as tearful, with sx of panic and feeling overwhelmed.  Pt reported a hx of depression and anxiety since she was a teenager.  Reported outpatient psychiatry and previous therapy but none currently.  Reported a recent addition of Wellbutrin about a month and a half ago which she feels is more helpful for her ADHD sx than her depression.  Admitted to decreasing her Zoloft  from 100 mg down to 50 mg about a month ago due to "fogginess" which she thought might help but reported it hasn't.  Reported issues in her previous school which lead her to change schools at the beginning of the semester but she feels like this was the trigger for her increase in depression and anxiety.  Reported last night she was in a "highly stressful state" and "said things that were concerning.  Reported some intrusive but passive SI while in a heightened state of anxiety.  Reported one previous suicide attempt at the age of 75 but denied any current plan or intent to act on the thoughts.  Denied  any previous inpatient psychiatric  admissions.  Denied sx of mania, psychosis or HI.  Reported she feels her anxiety is more prevalent which in-turn, causes depression and the intrusive SI.    Discussed her feelings of safety if discharged and she reported she feels safe to discharge to follow up with her outpatient psychiatric provider within next 2 weeks.  Reported she is open to resuming individual therapy and would like resources of available therapists in the area.  Discussed increasing the Zoloft  back up to 100 mg as prescribed as well as adding Hydroxyzine  10-20 mg po TID PRN for anxiety/panic attack.  Pt is open to these recommendations.        PAST PSYCHIATRIC HISTORY   Otherwise as per HPI above.  PAST MEDICAL HISTORY  History reviewed. No pertinent past medical history.   HOME MEDICATIONS  PTA Medications  Medication Sig   sertraline  (ZOLOFT ) 50 MG tablet Take 50 mg by mouth daily.   lisdexamfetamine (VYVANSE) 50 MG capsule Take 50 mg by mouth daily.   buPROPion ER (WELLBUTRIN SR) 100 MG 12 hr tablet Take 100 mg by mouth 2 (two) times daily.   VYVANSE 40 MG capsule Take 40 mg by mouth every morning. (Patient not taking: Reported on 09/11/2022)     ALLERGIES  Allergies  Allergen Reactions   Amoxicillin-Pot Clavulanate Rash   Cat Dander Nausea And Vomiting    SOCIAL & SUBSTANCE USE HISTORY  Social History   Socioeconomic History   Marital status: Single    Spouse name: Not on file   Number of children: Not on file   Years of education: Not on file   Highest education level: Not on file  Occupational History   Not on file  Tobacco Use   Smoking status: Never   Smokeless tobacco: Never  Vaping Use   Vaping status: Every Day  Substance and Sexual Activity   Alcohol use: No   Drug use: No   Sexual activity: Never    Birth control/protection: Abstinence  Other Topics Concern   Not on file  Social History Narrative   Not on file   Social Drivers of Health   Financial Resource Strain: Not on file   Food Insecurity: Not on file  Transportation Needs: Not on file  Physical Activity: Not on file  Stress: Not on file  Social Connections: Not on file   Social History   Tobacco Use  Smoking Status Never  Smokeless Tobacco Never   Social History   Substance and Sexual Activity  Alcohol Use No   Social History   Substance and Sexual Activity  Drug Use No    Additional pertinent information Lives with mother.  FAMILY HISTORY  Family History  Problem Relation Age of Onset   Hyperlipidemia Mother    Hypertension Mother    Heart disease Father 48       stent placement   Family Psychiatric History (if known):    MENTAL STATUS EXAM (MSE)  Mental Status Exam: General Appearance: Well Groomed  Orientation:  Full (Time, Place, and Person)  Memory:  Immediate;   Good Recent;   Good Remote;   Good  Concentration:  Concentration: Good and Attention Span: Good  Recall:  Good  Attention  Good  Eye Contact:  Good  Speech:  Clear and Coherent  Language:  Good  Volume:  Normal  Mood: Dysthymic, anxious  Affect:  Appropriate and Congruent  Thought Process:  Coherent and Goal Directed  Thought Content:  Logical  Suicidal Thoughts:  Yes.  without intent/plan  Homicidal Thoughts:  No  Judgement:  Good  Insight:  Good  Psychomotor Activity:  Normal  Akathisia:  No  Fund of Knowledge:  Good    Assets:  Communication Skills Desire for Improvement Housing Physical Health Social Support  Cognition:  WNL  ADL's:  Intact  AIMS (if indicated):       VITALS  Blood pressure 101/62, pulse 65, temperature 97.7 F (36.5 C), temperature source Oral, resp. rate 16, height 4\' 11"  (1.499 m), weight 86.2 kg, last menstrual period 04/26/2024, SpO2 98%.  LABS  Admission on 05/03/2024  Component Date Value Ref Range Status   Sodium 05/03/2024 141  135 - 145 mmol/L Final   Potassium 05/03/2024 4.1  3.5 - 5.1 mmol/L Final   Chloride 05/03/2024 106  98 - 111 mmol/L Final   CO2  05/03/2024 28  22 - 32 mmol/L Final   Glucose, Bld 05/03/2024 113 (H)  70 - 99 mg/dL Final   Glucose reference range applies only to samples taken after fasting for at least 8 hours.   BUN 05/03/2024 14  6 - 20 mg/dL Final   Creatinine, Ser 05/03/2024 0.82  0.44 - 1.00 mg/dL Final   Calcium 96/03/5408 9.8  8.9 - 10.3 mg/dL Final   Total Protein 81/19/1478 8.2 (H)  6.5 - 8.1 g/dL Final   Albumin 29/56/2130 4.6  3.5 - 5.0 g/dL Final   AST 86/57/8469 16  15 - 41 U/L Final   ALT 05/03/2024 16  0 - 44 U/L Final   Alkaline Phosphatase 05/03/2024 82  38 - 126 U/L Final   Total Bilirubin 05/03/2024 0.6  0.0 - 1.2 mg/dL Final   GFR, Estimated 05/03/2024 >60  >60 mL/min Final   Comment: (NOTE) Calculated using the CKD-EPI Creatinine Equation (2021)    Anion gap 05/03/2024 7  5 - 15 Final   Performed at [facility], [address] [place], [place] [address]   Alcohol, Ethyl (B) 05/03/2024 <15  <15 mg/dL Final   Comment: Please note change in reference range. (NOTE) For medical purposes only. Performed at [facility], [address] [place], [place] [address]    WBC 05/03/2024 10.0  4.0 - 10.5 K/uL Final   RBC 05/03/2024 5.25 (H)  3.87 - 5.11 MIL/uL Final   Hemoglobin 05/03/2024 14.8  12.0 - 15.0 g/dL Final   HCT 44/12/270 45.2  36.0 - 46.0 % Final   MCV 05/03/2024 86.1  80.0 - 100.0 fL Final   MCH 05/03/2024 28.2  26.0 - 34.0 pg Final   MCHC 05/03/2024 32.7  30.0 - 36.0 g/dL Final   RDW 53/66/4403 12.4  11.5 - 15.5 % Final   Platelets 05/03/2024 333  150 - 400 K/uL Final   nRBC 05/03/2024 0.0  0.0 - 0.2 % Final   Performed at [facility], [address] [place], [place] [address]   Tricyclic, Ur Screen 05/03/2024 NONE DETECTED  NONE DETECTED Final   Amphetamines, Ur Screen 05/03/2024 NONE DETECTED  NONE DETECTED Final   MDMA (Ecstasy)Ur Screen 05/03/2024 NONE DETECTED  NONE DETECTED Final   Cocaine Metabolite,Ur [place] 05/03/2024 NONE DETECTED  NONE  DETECTED Final   Opiate, Ur Screen 05/03/2024 NONE DETECTED  NONE DETECTED Final   Phencyclidine (PCP) Ur S 05/03/2024 NONE DETECTED  NONE DETECTED Final   Cannabinoid 50 Ng, Ur [place] 05/03/2024 POSITIVE (A)  NONE DETECTED Final   Barbiturates, Ur Screen 05/03/2024 NONE DETECTED  NONE DETECTED Final   Benzodiazepine, Ur Scrn 05/03/2024 NONE DETECTED  NONE DETECTED Final   Methadone Scn, Ur 05/03/2024 NONE DETECTED  NONE DETECTED Final   Comment: (NOTE) Tricyclics + metabolites, urine    Cutoff 1000 ng/mL Amphetamines + metabolites, urine  Cutoff 1000 ng/mL MDMA (Ecstasy), urine              Cutoff 500 ng/mL Cocaine Metabolite, urine          Cutoff 300 ng/mL Opiate + metabolites, urine        Cutoff 300 ng/mL Phencyclidine (PCP), urine         Cutoff 25 ng/mL Cannabinoid, urine                 Cutoff 50 ng/mL Barbiturates + metabolites, urine  Cutoff 200 ng/mL Benzodiazepine, urine              Cutoff 200 ng/mL Methadone, urine                   Cutoff 300 ng/mL  The urine drug screen provides only a preliminary, unconfirmed analytical test result and should not be used for non-medical purposes. Clinical consideration and professional judgment should be applied to any positive drug screen result due to possible interfering substances. A more specific alternate chemical method must be used in order to obtain a confirmed analytical result. Gas chromatography / mass spectrometry (GC/MS) is the preferred confirm                          atory method. Performed at [facility], [address] [place], [place] [address]    Preg Test, Ur 05/03/2024 NEGATIVE  NEGATIVE Final   Performed at [facility], [address] [place], [place] [address]    PSYCHIATRIC REVIEW OF SYSTEMS (ROS)  ROS: Notable for the following relevant positive findings: Review of Systems  Constitutional: Negative.   HENT: Negative.    Eyes: Negative.   Respiratory: Negative.    Cardiovascular:  Negative.   Gastrointestinal: Negative.   Genitourinary: Negative.   Musculoskeletal: Negative.   Skin: Negative.   Neurological: Negative.   Endo/Heme/Allergies: Negative.   Psychiatric/Behavioral:  Positive for depression and suicidal ideas (Intrusive but passive). The patient is nervous/anxious.     Additional findings:      Musculoskeletal: No abnormal movements observed      Gait & Station: Normal      Pain Screening: Denies      Nutrition & Dental Concerns: If yes - consider referral to nutritional or dental specialist  RISK FORMULATION/ASSESSMENT  Is the patient experiencing any suicidal or homicidal ideations: Yes       Explain if yes: Intrusive but passive SI with no plan or intent Protective factors considered for safety management: Willing to get help, supportive family, social supports  Risk factors/concerns considered for safety management:  Prior attempt Depression Hopelessness Unmarried  Is there a Astronomer plan with the patient and treatment team to minimize risk factors and promote protective factors: Yes           Explain: Medication, resources/referral to outpatient therapists, follow up with outpatient psychiatric provider within 2 weeks Is crisis care placement or psychiatric hospitalization recommended: No     Based on my current evaluation and risk assessment, patient is determined at this time to be at:  Low risk  *RISK ASSESSMENT Risk assessment is a dynamic process; it is possible that this  patient's condition, and risk level, may change. This should be re-evaluated and managed over time as appropriate. Please re-consult psychiatric consult services if additional assistance is needed in terms of risk assessment and management. If your team decides to discharge this patient, please advise the patient how to best access emergency psychiatric services, or to call 911, if their condition worsens or they feel unsafe in any way.   Loel Ring,  NP [facility] Consult Services

## 2024-05-04 NOTE — Discharge Instructions (Addendum)
 Hydroxyzine  10-20 mg po TID PRN for anxiety/panic attack.  Increase Zoloft  back up to 100 mg po daily for depression/anxiety.  Continue Wellbutrin as prescribed Non-Medication/therapeutic recommendations: Follow up with outpatient psychiatric provider within 2 weeks.  Provide resources/referrals for outpatient therapists in the area

## 2024-05-04 NOTE — ED Notes (Signed)
 Pt was given was given a lunch tray

## 2024-05-04 NOTE — ED Notes (Signed)
Refused shower. 

## 2024-05-04 NOTE — ED Notes (Signed)
Verified correct patient and correct discharge papers given. Pt alert and oriented X 4, stable for discharge. RR even and unlabored, color WNL. Discussed discharge instructions and follow-up as directed. Discharge medications discussed, when prescribed. Pt had opportunity to ask questions, and RN available to provide patient and/or family education.   

## 2024-05-04 NOTE — BH Assessment (Addendum)
 Collateral: atwater,karen (Mother) [phone] Mother expressed concerns about the pt's erratic moods and behaviors. Mother reported that the pt often expresses having feelings of worthlessness or hopelessness. Mother stated that the pt "has lost herself". Mother reported that the pt is taking a year off from college due to poor performance. Mother reported that the pt experienced bullying and had traumatic social experiences during her college career. Mother feels that the pt could use help.

## 2024-05-04 NOTE — Consult Note (Incomplete)
 [facility] Psychiatric Consult Initial  Patient Name: .Cassandra Reed  MRN: [id number]  DOB: September 07, 2001  Consult Order details:  Orders (From admission, onward)     Start     Ordered   05/03/24 2323  CONSULT TO CALL ACT TEAM       Ordering Provider: Claria Crofts, MD  Provider:  (Not yet assigned)  Question:  Reason for Consult?  Answer:  Psych consult   05/03/24 2323   05/03/24 2323  IP CONSULT TO PSYCHIATRY       Ordering Provider: Claria Crofts, MD  Provider:  (Not yet assigned)  Question Answer Comment  Consult Timeframe ROUTINE - requires response within 24 hours   Reason for Consult? Consult for medication management   Contact phone number where the requesting provider can be reached [phone]      05/03/24 2323             Mode of Visit: {Type of visit:31911}    Psychiatry Consult Evaluation  Service Date: May 03, 2024 LOS:  LOS: 0 days  Chief Complaint ***  Primary Psychiatric Diagnoses  *** 2.  *** 3.  ***  Assessment  Cassandra Reed is a 23 y.o. female admitted: {CHL BH Medical or Presented to PP:29518}ACZ 05/03/2024 10:41 PM for ***. She carries the psychiatric diagnoses of *** and has a past medical history of  ***.   Her current presentation of *** is most consistent with ***. She meets criteria for *** based on ***.  Current outpatient psychotropic medications include *** and historically she has had a *** response to these medications. She was *** compliant with medications prior to admission as evidenced by ***. On initial examination, patient ***. Please see plan below for detailed recommendations.   Diagnoses:  Active Hospital problems: Active Problems:   * No active hospital problems. *    Plan   ## Psychiatric Medication Recommendations:  ***  ## Medical Decision Making Capacity: {CHL BH MEDICAL DECISION MAKING CAPACITY:31818}  ## Further Work-up:  -- *** {CHLmacgeneralandspecificworkuprecs:31821} -- most recent EKG on *** had QtC of  *** -- Pertinent labwork reviewed earlier this admission includes: ***   ## Disposition:-- {CHLmaccldispo:31820}  ## Behavioral / Environmental: -{CHLmacbehavioralenvironmental2:31847}    ## Safety and Observation Level:  - Based on my clinical evaluation, I estimate the patient to be at *** risk of self harm in the current setting. - At this time, we recommend  {CHL BH SUICIDE OBSERVATION LEVEL:31850}. This decision is based on my review of the chart including patient's history and current presentation, interview of the patient, mental status examination, and consideration of suicide risk including evaluating suicidal ideation, plan, intent, suicidal or self-harm behaviors, risk factors, and protective factors. This judgment is based on our ability to directly address suicide risk, implement suicide prevention strategies, and develop a safety plan while the patient is in the clinical setting. Please contact our team if there is a concern that risk level has changed.  CSSR Risk Category:C-SSRS RISK CATEGORY: Moderate Risk  Suicide Risk Assessment: Patient has following modifiable risk factors for suicide: {CHLmacmodifiablesuicideriskfactors:31822}, which we are addressing by ***. Patient has following non-modifiable or demographic risk factors for suicide: {CHLmacnonmodifiablesuicideriskfactors:31823} Patient has the following protective factors against suicide: {CHLmacprotectivefactors:31824}  Thank you for this consult request. Recommendations have been communicated to the primary team.  We will *** at this time.   Al Alias, NP       History of Present Illness  Relevant Aspects of Hospital {  [facility] or ED course:31819} Course:  Admitted on 05/03/2024 for ***. They ***.   Patient Report:  ***  Psych ROS:  Depression: *** Anxiety:  *** Mania (lifetime and current): *** Psychosis: (lifetime and current): ***  Collateral information:  Contacted *** at *** on  ***  Review of Systems  All other systems reviewed and are negative.    Psychiatric and Social History  Psychiatric History:  Information collected from ***  Prev Dx/Sx: *** Current Psych Provider: *** Home Meds (current): *** Previous Med Trials: *** Therapy: ***  Prior Psych Hospitalization: ***  Prior Self Harm: *** Prior Violence: ***  Family Psych History: *** Family Hx suicide: ***  Social History:  Developmental Hx: *** Educational Hx: *** Occupational Hx: *** Legal Hx: *** Living Situation: *** Spiritual Hx: *** Access to weapons/lethal means: ***   Substance History Alcohol: ***  Type of alcohol *** Last Drink *** Number of drinks per day *** History of alcohol withdrawal seizures *** History of DT's *** Tobacco: *** Illicit drugs: *** Prescription drug abuse: *** Rehab hx: ***  Exam Findings  Physical Exam: *** Vital Signs:  Temp:  [98.4 F (36.9 C)] 98.4 F (36.9 C) (05/18 2232) Pulse Rate:  [72] 72 (05/18 2232) Resp:  [18] 18 (05/18 2232) BP: (110)/(77) 110/77 (05/18 2232) SpO2:  [98 %] 98 % (05/18 2232) Weight:  [86.2 kg] 86.2 kg (05/18 2228) Blood pressure 110/77, pulse 72, temperature 98.4 F (36.9 C), temperature source Oral, resp. rate 18, height 4\' 11"  (1.499 m), weight 86.2 kg, last menstrual period 04/26/2024, SpO2 98%. Body mass index is 38.38 kg/m.  Physical Exam  Mental Status Exam: General Appearance: {Appearance:22683}  Orientation:  {BHH ORIENTATION (PAA):22689}  Memory:  {BHH MEMORY:22881}  Concentration:  {Concentration:21399}  Recall:  {BHH GOOD/FAIR/POOR:22877}  Attention  {BH Attention Span:31825}  Eye Contact:  {BHH EYE CONTACT:22684}  Speech:  {Speech:22685}  Language:  {BHH GOOD/FAIR/POOR:22877}  Volume:  {Volume (PAA):22686}  Mood: ***  Affect:  {Affect (PAA):22687}  Thought Process:  {Thought Process (PAA):22688}  Thought Content:  {Thought Content:22690}  Suicidal Thoughts:  {ST/HT (PAA):22692}   Homicidal Thoughts:  {ST/HT (PAA):22692}  Judgement:  {Judgement (PAA):22694}  Insight:  {Insight (PAA):22695}  Psychomotor Activity:  {Psychomotor (PAA):22696}  Akathisia:  {BHH YES OR NO:22294}  Fund of Knowledge:  {BHH GOOD/FAIR/POOR:22877}      Assets:  {Assets (PAA):22698}  Cognition:  {chl bhh cognition:304700322}  ADL's:  {BHH HYQ'M:57846}  AIMS (if indicated):        Other History   These have been pulled in through the EMR, reviewed, and updated if appropriate.  Family History:  The patient's family history includes Heart disease (age of onset: 67) in her father; Hyperlipidemia in her mother; Hypertension in her mother.  Medical History: History reviewed. No pertinent past medical history.  Surgical History: Past Surgical History:  Procedure Laterality Date  . APPENDECTOMY  09/01/2017  . LAPAROSCOPIC APPENDECTOMY N/A 09/01/2017   Procedure: APPENDECTOMY LAPAROSCOPIC;  Surgeon: Verlena Glenn, MD;  Location: [facility] OR;  Service: General;  Laterality: N/A;     Medications:  No current facility-administered medications for this encounter.  Current Outpatient Medications:  .  albuterol (VENTOLIN HFA) 108 (90 Base) MCG/ACT inhaler, Inhale into the lungs., Disp: , Rfl:  .  lamoTRIgine  (LAMICTAL ) 25 MG tablet, Take 1 tablet (25 mg total) by mouth daily. (Patient not taking: Reported on 09/11/2022), Disp: 30 tablet, Rfl: 0 .  lisdexamfetamine (VYVANSE) 50 MG capsule, Take 50 mg by  mouth daily., Disp: , Rfl:  .  sertraline (ZOLOFT) 50 MG tablet, Take by mouth., Disp: , Rfl:  .  VYVANSE 40 MG capsule, Take 40 mg by mouth every morning. (Patient not taking: Reported on 09/11/2022), Disp: , Rfl: 0  Allergies: Allergies  Allergen Reactions  . Amoxicillin-Pot Clavulanate Rash  . Cat Dander Nausea And Vomiting    Annalee Meyerhoff Cheril Cork, NP

## 2024-05-04 NOTE — ED Provider Notes (Signed)
 Pt cleared by psych  Hydroxyzine  10-20 mg po TID PRN for anxiety/panic attack.  Increase Zoloft  back up to 100 mg po daily for depression/anxiety.  Continue Wellbutrin as prescribed Non-Medication/therapeutic recommendations: Follow up with outpatient psychiatric provider within 2 weeks.  Provide resources/referrals for outpatient therapists in the area. DC info given.    Lubertha Rush, MD 05/04/24 [phone]

## 2024-05-13 ENCOUNTER — Encounter: Payer: Self-pay | Admitting: Psychiatry

## 2024-05-13 ENCOUNTER — Ambulatory Visit: Admitting: Psychiatry

## 2024-05-13 VITALS — BP 118/80 | HR 78 | Temp 98.7°F | Ht 59.0 in | Wt 190.6 lb

## 2024-05-13 DIAGNOSIS — F4010 Social phobia, unspecified: Secondary | ICD-10-CM

## 2024-05-13 DIAGNOSIS — F331 Major depressive disorder, recurrent, moderate: Secondary | ICD-10-CM

## 2024-05-13 NOTE — Progress Notes (Signed)
 Psychiatric Initial Adult Assessment   Patient Identification: Cassandra Reed MRN:  [id number] Date of Evaluation:  05/13/2024 Referral Source: Dayne Even, MD Chief Complaint:   Chief Complaint  Patient presents with   Establish Care   Visit Diagnosis:    ICD-10-CM   1. Moderate episode of recurrent major depressive disorder (HCC)  F33.1     2. Social anxiety disorder  F40.10       History of Present Illness: 23 year old female presents to [facility] for establishing care.  This is after patient was admitted at the ED on 05/03/2024.  Patient was admitted for suicidal ideation in which she stated that she was overstimulated as well as anxious and stated that after she was able to relax and calm self down she was able to stop the suicidal ideations.  Patient reports that she is currently living at home with her mother.  Patient previously was attending school to abstain but reported that her anxiety as well as her feeling overwhelming responsibilities have caused her to withdraw from school.  Patient currently is planning on staying at home for the summer while working at her job to try to stay before car and to apply for college again.  Patient is interested in stage play as well as theater.  Patient reports that she would like to return back to college like [employer] or [employer].  Patient reports that she has significant psychiatric history with previous suicidal attempts as well as suicidal ideation within this past week.  Patient reports that she was placed on sertraline  100 mg and started heaviest suicidal ideation when she reduced to 50 mg a day.  Patient's mother is involved with care reporting that she is concerned about the amount of medications that her daughter is taking.  Patient has and mother have been educated on medications purpose as well as dosages and the plan for managing depressive symptoms as well as anxiety.  Patient reports that she would like to participate in therapy and  was recommended for her to ask for a therapist who specializes in CBT therapy with anxiety.  Patient is agreement has been provided a therapy list for the local area.  At this time patient is currently taking the regimen of 100 mg of Zoloft  once daily.  Wellbutrin 100 mg twice a day.  And hydroxyzine  10 to 20 mg 3 times a day as needed for anxiety.  Patient is also currently taking Vyvanse 50 mg currently being prescribed by her PCP with questionable agitation and irritation from this medication due to possible bipolar disorder that is being investigated.  Patient is currently in agreement with treatment plan.  Patient is going to remain on the current medication regimen and will follow up in 2 weeks.  Patient has agreed to contact the provider by call or my chart messaging if she has worsening symptoms before the next visit.  Currently patient denies SI, HI, AVH.  Patient denies having a firearm in the home. Patient agrees that should she have suicidal thoughts with a plan she will call 911 or go to the emergency department.  Patient with no other needs or concerns at this time patient and will follow up in 2 weeks.  Associated Signs/Symptoms: Depression Symptoms:  difficulty concentrating, (Hypo) Manic Symptoms: Negative Anxiety Symptoms:  Excessive Worry, Panic Symptoms, Psychotic Symptoms: Negative PTSD Symptoms: Negative  Past Psychiatric History:  Previous Psych Hospitalizations: - ER visit at [facility] on 05/03/2024 for suicidal ideation -December 2018 patient was [facility] at: [facility], for suicidal attempt with ingestion on medications. Outpatient treatment:  - Currently being managed by PCP Sigrid Drilling MD.  Medications Current: - Zoloft  100 mg once daily - Wellbutrin 100 mg extended release twice a day - Hydroxyzine  10 to 20 mg 3 times a day as needed for anxiety - Vyvanse 50 mg daily currently being prescribed by PCP  Next Steps: - Monitor for potential bipolar  disorder  Medication Trials: - Patient reports no recent medication trials Suicide & Violence: - Currently denies SI, HI, AVH -Previous suicide attempt in 2018  Substance Use: - Daily marijuana user, currently attempting to cut down on marijuana use  Psychotherapy: - Patient is interested in CBT for anxiety, patient has been provided local therapist list.  Legal:  - Denies  Previous Psychotropic Medications: Yes   Substance Abuse History in the last 12 months:  Yes.    Consequences of Substance Abuse: Medical Consequences:  Patient has been educated on the psychoactive nature of marijuana use and stating that she has tried to quit.  Reporting that she has started this habit several years ago from a roommate in college and reports that she typically smokes daily but is trying to cut back.  Past Medical History: History reviewed. No pertinent past medical history.  Past Surgical History:  Procedure Laterality Date   APPENDECTOMY  09/01/2017   LAPAROSCOPIC APPENDECTOMY N/A 09/01/2017   Procedure: APPENDECTOMY LAPAROSCOPIC;  Surgeon: Verlena Glenn, MD;  Location: [facility] OR;  Service: General;  Laterality: N/A;    Family Psychiatric History: Denies family psychiatric history  Family History:  Family History  Problem Relation Age of Onset   Depression Mother    Hyperlipidemia Mother    Hypertension Mother    Heart disease Father 93       stent placement   Bipolar disorder Maternal Aunt     Social History:   Social History   Socioeconomic History   Marital status: Single    Spouse name: Not on file   Number of children: Not on file   Years of education: Not on file   Highest education level: Some college, no degree  Occupational History   Not on file  Tobacco Use   Smoking status: Never   Smokeless tobacco: Never  Vaping Use   Vaping status: Every Day   Substances: THC  Substance and Sexual Activity   Alcohol use: No   Drug use: No   Sexual activity: Not  Currently    Birth control/protection: Abstinence  Other Topics Concern   Not on file  Social History Narrative   Not on file   Social Drivers of Health   Financial Resource Strain: Not on file  Food Insecurity: Not on file  Transportation Needs: Not on file  Physical Activity: Not on file  Stress: Not on file  Social Connections: Not on file    Additional Social History: Negative social  Allergies:   Allergies  Allergen Reactions   Amoxicillin-Pot Clavulanate Rash   Cat Dander Nausea And Vomiting    Metabolic Disorder Labs: Lab Results  Component Value Date   HGBA1C 4.4 (L) 11/23/2017   MPG 79.58 11/23/2017   No results found for: "PROLACTIN" Lab Results  Component Value Date   CHOL 130 11/23/2017   TRIG 51 11/23/2017   HDL 41 11/23/2017   CHOLHDL 3.2 11/23/2017   VLDL 10 11/23/2017   LDLCALC 79 11/23/2017   Lab Results  Component Value Date   TSH 0.496 11/23/2017  Therapeutic Level Labs: No results found for: "LITHIUM" No results found for: "CBMZ" No results found for: "VALPROATE"  Current Medications: Current Outpatient Medications  Medication Sig Dispense Refill   buPROPion ER (WELLBUTRIN SR) 100 MG 12 hr tablet Take 100 mg by mouth 2 (two) times daily.     hydrOXYzine  (ATARAX ) 10 MG tablet Take 1-2 tablets (10-20 mg total) by mouth 3 (three) times daily as needed for anxiety. 30 tablet 0   lurasidone (LATUDA) 40 MG TABS tablet 40 mg.     sertraline  (ZOLOFT ) 100 MG tablet Take 100 mg by mouth every morning.     lisdexamfetamine (VYVANSE) 50 MG capsule Take 50 mg by mouth daily.     No current facility-administered medications for this visit.    Musculoskeletal: Strength & Muscle Tone: within normal limits Gait & Station: normal Patient leans: N/A  Psychiatric Specialty Exam: Review of Systems  Constitutional: Negative.   HENT: Negative.    Eyes: Negative.   Respiratory: Negative.    Cardiovascular: Negative.   Gastrointestinal:  Negative.   Endocrine: Negative.   Genitourinary: Negative.   Musculoskeletal: Negative.   Allergic/Immunologic: Negative.   Neurological: Negative.   Hematological: Negative.   Psychiatric/Behavioral:  Positive for dysphoric mood. The patient is nervous/anxious.     Blood pressure 118/80, pulse 78, temperature 98.7 F (37.1 C), temperature source Temporal, height 4\' 11"  (1.499 m), weight 190 lb 9.6 oz (86.5 kg), last menstrual period 04/26/2024, SpO2 96%.Body mass index is 38.5 kg/m.  General Appearance: Well Groomed  Eye Contact:  Good  Speech:  Clear and Coherent  Volume:  Normal  Mood:  Anxious and Depressed  Affect:  Depressed  Thought Process:  Coherent  Orientation:  Full (Time, Place, and Person)  Thought Content:  Logical  Suicidal Thoughts:  No  Homicidal Thoughts:  No  Memory:  Immediate;   Good Recent;   Good Remote;   Good  Judgement:  Good  Insight:  Good  Psychomotor Activity:  Normal  Concentration:  Concentration: Good and Attention Span: Good  Recall:  Good  Fund of Knowledge:Good  Language: Good  Akathisia:  No  Handed:  Right  AIMS (if indicated):  not done  Assets:  Financial Resources/Insurance Housing Vocational/Educational  ADL's:  Intact  Cognition: WNL  Sleep:  Good   Screenings: AIMS    Flowsheet Row [facility]  AIMS Total Score 0      Flowsheet Row ED from 05/03/2024 in [facility] Emergency Department at [facility]  C-SSRS RISK CATEGORY Moderate Risk       Assessment and Plan:  Assessment - Diagnosis: Moderate episode of recurrent major depressive disorder (HCC) [F33.1]  2. Social anxiety disorder [F40.10]   Differential Diagnosis: Bipolar Disorder, pt endorses previous manic-like behavior  - Progress: Baseline appointment - Risk Factors: Worsening symptoms  Plan - Medications:  Zoloft  100 mg once daily, patient has been educated on  the purpose of the medication educated on the nature of the SSRI.  Patient has been educated that this medication does not work right away and takes up to 2 to 4 weeks for it to work and states that any medication changes with SSRIs will take time to feel the therapeutic effects.  Patient has been encouraged to take this medication at the same time every day and consistently. Wellbutrin ER 100 mg BID daily, patient was started on this medication while at the ER the last visit has been  encouraged to continue taking his medication patient endorses feeling better with this medication.  Patient has been educated on the initial nervousness jitteriness as well as headaches with initial dosage of the medication and has been encouraged to call the provider should it last longer than a week. Hydroxyzine  10-20mg  3 times a day as needed prn.  Patient has been educated on the sedating nature of this medication and encouraged not to take the medication while driving or operating machinery. - Psychotherapy: Provided a local therapy resource.  - Education: Patient has been educated on how to contact her provider through my chart messaging or by calling the clinic.  Patient has been educated on medications, purpose, side effects and adverse reactions. - Follow-Up: Pt will follow-up in 1 month.  - Referrals: No referrals at this time - Safety Planning: The patient has been educated, if they should have suicidal thoughts with or without a plan to call 911, or go to the closest emergency department.  Pt verbalized understanding.  Pt denies firearms within the home.  Pt also agrees to call the clinic should they have worsening symptoms before the next appointment.     Patient/Guardian was advised Release of Information must be obtained prior to any record release in order to collaborate their care with an outside provider. Patient/Guardian was advised if they have not already done so to contact the registration department to  sign all necessary forms in order for us  to release information regarding their care.   Consent: Patient/Guardian gives verbal consent for treatment and assignment of benefits for services provided during this visit. Patient/Guardian expressed understanding and agreed to proceed.   This office note has been dictated. This dictation was prepared using Air traffic controller. As a result, errors may occur. When identified, these errors have been corrected. While every attempt is made to correct errors during dictation, errors may still exist.   Arlana Labor, NP 5/28/20251:18 PM

## 2024-05-23 DIAGNOSIS — F331 Major depressive disorder, recurrent, moderate: Secondary | ICD-10-CM | POA: Diagnosis not present

## 2024-05-30 DIAGNOSIS — F331 Major depressive disorder, recurrent, moderate: Secondary | ICD-10-CM | POA: Diagnosis not present

## 2024-06-02 ENCOUNTER — Other Ambulatory Visit: Payer: Self-pay

## 2024-06-02 ENCOUNTER — Encounter: Payer: Self-pay | Admitting: Psychiatry

## 2024-06-02 ENCOUNTER — Ambulatory Visit: Admitting: Psychiatry

## 2024-06-02 VITALS — BP 124/86 | HR 82 | Temp 97.6°F | Ht 59.0 in | Wt 186.6 lb

## 2024-06-02 DIAGNOSIS — F4010 Social phobia, unspecified: Secondary | ICD-10-CM | POA: Diagnosis not present

## 2024-06-02 DIAGNOSIS — F331 Major depressive disorder, recurrent, moderate: Secondary | ICD-10-CM | POA: Diagnosis not present

## 2024-06-02 NOTE — Progress Notes (Signed)
 [facility] MD/PA/NP OP Progress Note  06/02/2024 2:40 PM Cassandra Reed  MRN:  [id number]  Chief Complaint:  Chief Complaint  Patient presents with   Follow-up   HPI: 23 year old female presenting to [facility] for medication management and follow-up.  Patient reports she is doing well on medications stating that the medication regimen currently is satisfied and is managing her symptoms.  Patient reports no significant changes within the last 2 weeks stating that she continues to remain at home and states she is getting in her typical arguments with her mother but reports that their relationships is important and developing.  Patient states that she denies SI, HI, AVH.  Patient also endorses that the current regimen and hydroxyzine  that she started is doing well in managing her anxiety that spirals .  Patient has multiple job applications that are being submitted at this time and patient continues to search for summertime job.  Patient continues to be optimistic about starting school next year and states that she is working on trying to say for car.  Based on this assessment interview is recommended for the patient to continue medications as prescribed and the patient to follow up in 1 month.  Patient with no other questions or concerns at this time.  Patient is in agreement with treatment plan. Visit Diagnosis:    ICD-10-CM   1. Moderate episode of recurrent major depressive disorder (HCC)  F33.1     2. Social anxiety disorder  F40.10       Past Psychiatric History:  Previous Psych Hospitalizations: - ER visit at [facility] on 05/03/2024 for suicidal ideation -December 2018 patient was admitted at: [facility], for suicidal attempt with ingestion on medications. Outpatient treatment:  - Currently being managed by PCP Sigrid Drilling MD.   Medications Current: - Zoloft  100 mg once daily - Wellbutrin 100 mg extended release twice a day - Hydroxyzine  10 to 20 mg 3 times a day as  needed for anxiety - Vyvanse 50 mg daily currently being prescribed by PCP   Next Steps: - Monitor for potential bipolar disorder   Medication Trials: - Patient reports no recent medication trials Suicide & Violence: - Currently denies SI, HI, AVH -Previous suicide attempt in 2018   Substance Use: - Daily marijuana user, currently attempting to cut down on marijuana use   Psychotherapy: - Patient is interested in CBT for anxiety, patient has been provided local therapist list.   Legal:  - Denies  Past Medical History: History reviewed. No pertinent past medical history.  Past Surgical History:  Procedure Laterality Date   APPENDECTOMY  09/01/2017   LAPAROSCOPIC APPENDECTOMY N/A 09/01/2017   Procedure: APPENDECTOMY LAPAROSCOPIC;  Surgeon: Verlena Glenn, MD;  Location: [facility] OR;  Service: General;  Laterality: N/A;    Family Psychiatric History: No additional  Family History:  Family History  Problem Relation Age of Onset   Depression Mother    Hyperlipidemia Mother    Hypertension Mother    Heart disease Father 39       stent placement   Bipolar disorder Maternal Aunt     Social History:  Social History   Socioeconomic History   Marital status: Single    Spouse name: Not on file   Number of children: Not on file   Years of education: Not on file   Highest education level: Some college, no degree  Occupational History   Not on file  Tobacco Use   Smoking status: Never   Smokeless  tobacco: Never  Vaping Use   Vaping status: Every Day   Substances: THC  Substance and Sexual Activity   Alcohol use: No   Drug use: No   Sexual activity: Not Currently    Birth control/protection: Abstinence  Other Topics Concern   Not on file  Social History Narrative   Not on file   Social Drivers of Health   Financial Resource Strain: Not on file  Food Insecurity: Not on file  Transportation Needs: Not on file  Physical Activity: Not on file  Stress: Not on file   Social Connections: Not on file    Allergies:  Allergies  Allergen Reactions   Amoxicillin-Pot Clavulanate Rash   Cat Dander Nausea And Vomiting    Metabolic Disorder Labs: Lab Results  Component Value Date   HGBA1C 4.4 (L) 11/23/2017   MPG 79.58 11/23/2017   No results found for: PROLACTIN Lab Results  Component Value Date   CHOL 130 11/23/2017   TRIG 51 11/23/2017   HDL 41 11/23/2017   CHOLHDL 3.2 11/23/2017   VLDL 10 11/23/2017   LDLCALC 79 11/23/2017   Lab Results  Component Value Date   TSH 0.496 11/23/2017    Therapeutic Level Labs: No results found for: LITHIUM No results found for: VALPROATE No results found for: CBMZ  Current Medications: Current Outpatient Medications  Medication Sig Dispense Refill   buPROPion ER (WELLBUTRIN SR) 100 MG 12 hr tablet Take 100 mg by mouth 2 (two) times daily.     hydrOXYzine  (ATARAX ) 10 MG tablet Take 1-2 tablets (10-20 mg total) by mouth 3 (three) times daily as needed for anxiety. 30 tablet 0   lisdexamfetamine (VYVANSE) 50 MG capsule Take 50 mg by mouth daily.     sertraline  (ZOLOFT ) 100 MG tablet Take 100 mg by mouth every morning.     No current facility-administered medications for this visit.     Musculoskeletal: Strength & Muscle Tone: within normal limits Gait & Station: normal Patient leans: N/A  Psychiatric Specialty Exam: Review of Systems  Constitutional: Negative.   HENT: Negative.    Eyes: Negative.   Respiratory: Negative.    Cardiovascular: Negative.   Gastrointestinal: Negative.   Endocrine: Negative.   Genitourinary: Negative.   Musculoskeletal: Negative.   Skin: Negative.   Allergic/Immunologic: Negative.   Neurological: Negative.   Psychiatric/Behavioral:  The patient is nervous/anxious.     Blood pressure 124/86, pulse 82, temperature 97.6 F (36.4 C), temperature source Temporal, height 4' 11 (1.499 m), weight 186 lb 9.6 oz (84.6 kg), last menstrual period 04/26/2024.Body  mass index is 37.69 kg/m.  General Appearance: Well Groomed  Eye Contact:  Good  Speech:  Clear and Coherent  Volume:  Normal  Mood:  Euthymic  Affect:  Appropriate  Thought Process:  Coherent  Orientation:  Full (Time, Place, and Person)  Thought Content: Logical   Suicidal Thoughts:  No  Homicidal Thoughts:  No  Memory:  Immediate;   Good Recent;   Good Remote;   Good  Judgement:  Good  Insight:  Good  Psychomotor Activity:  Normal  Concentration:  Concentration: Good and Attention Span: Good  Recall:  Good  Fund of Knowledge: Good  Language: Good  Akathisia:  No  Handed:  Right  AIMS (if indicated):   Assets:  Desire for Improvement Financial Resources/Insurance Housing  ADL's:  Intact  Cognition: WNL  Sleep:  Good   Screenings: AIMS    Flowsheet Row [facility]  AIMS Total Score 0   PHQ2-9    Flowsheet Row Office Visit from 05/13/2024 in [facility]  PHQ-2 Total Score 4  PHQ-9 Total Score 17   Flowsheet Row Office Visit from [facility]13/2024 in [facility] ED from 05/03/2024 in [facility] Emergency Department at [facility]  C-SSRS RISK CATEGORY Low Risk Moderate Risk     Assessment and Plan:  Assessment - Diagnosis: Moderate episode of recurrent major depressive disorder (HCC) [F33.1]  2. Social anxiety disorder [F40.10]    Differential Diagnosis: Bipolar Disorder, pt endorses previous manic-like behavior  - Progress: Patient reports that her medications are going well and she does not state any significant changes since the last 2 weeks. - Risk Factors: Worsening symptoms  Plan - Medications:  Continue Zoloft  100 mg once daily, patient has been educated on the purpose of the medication educated on the nature of the SSRI.  Patient has been educated that this medication does not work right away  and takes up to 2 to 4 weeks for it to work and states that any medication changes with SSRIs will take time to feel the therapeutic effects.  Patient has been encouraged to take this medication at the same time every day and consistently. Continue Wellbutrin ER 100 mg BID daily, patient was started on this medication while at the ER the last visit has been encouraged to continue taking his medication patient endorses feeling better with this medication.  Patient has been educated on the initial nervousness jitteriness as well as headaches with initial dosage of the medication and has been encouraged to call the provider should it last longer than a week. Continue Hydroxyzine  10-20mg  3 times a day as needed prn.  Patient has been educated on the sedating nature of this medication and encouraged not to take the medication while driving or operating machinery. - Psychotherapy: Provided a local therapy resource.  - Education: Patient has been educated on how to contact her provider through my chart messaging or by calling the clinic.  Patient has been educated on medications, purpose, side effects and adverse reactions. - Follow-Up: Pt will follow-up in 1 month.  - Referrals: No referrals at this time - Safety Planning: The patient has been educated, if they should have suicidal thoughts with or without a plan to call 911, or go to the closest emergency department.  Pt verbalized understanding.  Pt denies firearms within the home.  Pt also agrees to call the clinic should they have worsening symptoms before the next appointment.  Patient/Guardian was advised Release of Information must be obtained prior to any record release in order to collaborate their care with an outside provider. Patient/Guardian was advised if they have not already done so to contact the registration department to sign all necessary forms in order for us  to release information regarding their care.   Consent: Patient/Guardian gives  verbal consent for treatment and assignment of benefits for services provided during this visit. Patient/Guardian expressed understanding and agreed to proceed.    Arlana Labor, NP 06/02/2024, 2:40 PM

## 2024-06-20 DIAGNOSIS — F331 Major depressive disorder, recurrent, moderate: Secondary | ICD-10-CM | POA: Diagnosis not present

## 2024-07-07 ENCOUNTER — Encounter: Payer: Self-pay | Admitting: Psychiatry

## 2024-07-07 ENCOUNTER — Other Ambulatory Visit: Payer: Self-pay

## 2024-07-07 ENCOUNTER — Ambulatory Visit (INDEPENDENT_AMBULATORY_CARE_PROVIDER_SITE_OTHER): Admitting: Psychiatry

## 2024-07-07 VITALS — BP 126/88 | HR 118 | Temp 98.6°F | Ht 59.0 in | Wt 199.0 lb

## 2024-07-07 DIAGNOSIS — F900 Attention-deficit hyperactivity disorder, predominantly inattentive type: Secondary | ICD-10-CM | POA: Diagnosis not present

## 2024-07-07 DIAGNOSIS — F4010 Social phobia, unspecified: Secondary | ICD-10-CM

## 2024-07-07 DIAGNOSIS — F331 Major depressive disorder, recurrent, moderate: Secondary | ICD-10-CM

## 2024-07-07 MED ORDER — HYDROXYZINE HCL 10 MG PO TABS: 10.0000 mg | ORAL_TABLET | Freq: Three times a day (TID) | ORAL | 1 refills | Status: DC | PRN

## 2024-07-07 MED ORDER — SERTRALINE HCL 100 MG PO TABS: 100.0000 mg | ORAL_TABLET | Freq: Every morning | ORAL | 2 refills | Status: DC

## 2024-07-07 MED ORDER — BUPROPION HCL ER (SR) 100 MG PO TB12: 100.0000 mg | ORAL_TABLET | Freq: Two times a day (BID) | ORAL | 2 refills | Status: DC

## 2024-07-07 MED ORDER — LISDEXAMFETAMINE DIMESYLATE 40 MG PO CAPS: 40.0000 mg | ORAL_CAPSULE | ORAL | 0 refills | Status: DC

## 2024-07-07 NOTE — Progress Notes (Signed)
 BH MD/PA/NP OP Progress Note  07/07/2024 2:32 PM Cassandra Reed  MRN:  [id number]  Chief Complaint:  Chief Complaint  Patient presents with   Follow-up   HPI: 23 year old female presenting to [facility] for follow-up.  Patient reports today that she is really happy that she was able to get a job with in which she is working at a [employer] that is popular in this area.  Patient reports she is looking forward to being able to save the money to be able to save up for car.  Patient states that currently she is having no SI or no HI at this time or no AVH.  Patient reports that as of now her mood has been increased and she is feeling much better about herself now that she feels like she is making progress on her plan.  Patient reports her current medication regimen is doing well and she is feeling her best at this time.  Patient has been instructed to contact the provider if her symptoms worsen.  Patient reports that she is unable to find her ADHD provider stating that she would like to get managed as she was previously on 50 mg of Vyvanse  once daily.  Patient reports that medication is too much and she would like to has to be reduced to 40 mg once daily in which she states that she is able to manage well and only takes it when she needs it for work or school.  Patient has agreed that if this provider is to manage her medications she will get neuropsychiatric testing and drug tests while under the care of this provider.  Prescription has been provided and patient has agreed to the requirements.  Patient with no other questions or concerns at this time patient is in agreement with treatment plan.  Patient with no medication adjustments at this time other than the addition of Vyvanse  40 mg once daily.  Patient will follow up in 1 month.  I personally spent a total of 30 minutes in the care of the patient today including preparing to see the patient, getting/reviewing separately obtained history,  performing a medically appropriate exam/evaluation, counseling and educating, placing orders, referring and communicating with other health care professionals, and coordinating care.   Visit Diagnosis:    ICD-10-CM   1. Moderate episode of recurrent major depressive disorder (HCC)  F33.1 sertraline  (ZOLOFT ) 100 MG tablet    buPROPion  ER (WELLBUTRIN  SR) 100 MG 12 hr tablet    2. Social anxiety disorder  F40.10 hydrOXYzine  (ATARAX ) 10 MG tablet    3. ADHD (attention deficit hyperactivity disorder), inattentive type  F90.0 lisdexamfetamine (VYVANSE ) 40 MG capsule      Past Psychiatric History:  Previous Psych Hospitalizations: - ER visit at [facility] on 05/03/2024 for suicidal ideation -December 2018 patient was admitted at: [facility], for suicidal attempt with ingestion on medications. Outpatient treatment:  - Currently being managed by PCP [name].   Medications Current: - Zoloft  100 mg once daily - Wellbutrin  100 mg extended release twice a day - Hydroxyzine  10 to 20 mg 3 times a day as needed for anxiety - Vyvanse  40 mg daily    Next Steps: - Monitor for potential bipolar disorder   Medication Trials: - Patient reports no recent medication trials Suicide & Violence: - Currently denies SI, HI, AVH -Previous suicide attempt in 2018   Substance Use: - Daily marijuana user, currently attempting to cut down on marijuana use  Psychotherapy: - Patient is interested in CBT for anxiety, patient has been provided local therapist list.   Legal:  - Denies  Past Medical History: History reviewed. No pertinent past medical history.  Past Surgical History:  Procedure Laterality Date   APPENDECTOMY  09/01/2017   LAPAROSCOPIC APPENDECTOMY N/A 09/01/2017   Procedure: APPENDECTOMY LAPAROSCOPIC;  Surgeon: Chuckie Casimiro KIDD, MD;  Location: [facility] OR;  Service: General;  Laterality: N/A;    Family Psychiatric History: No Additional  Family History:  Family  History  Problem Relation Age of Onset   Depression Mother    Hyperlipidemia Mother    Hypertension Mother    Heart disease Father 40       stent placement   Bipolar disorder Maternal Aunt     Social History:  Social History   Socioeconomic History   Marital status: Single    Spouse name: Not on file   Number of children: Not on file   Years of education: Not on file   Highest education level: Some college, no degree  Occupational History   Not on file  Tobacco Use   Smoking status: Never   Smokeless tobacco: Never  Vaping Use   Vaping status: Every Day   Substances: THC  Substance and Sexual Activity   Alcohol use: No   Drug use: No   Sexual activity: Not Currently    Birth control/protection: Abstinence  Other Topics Concern   Not on file  Social History Narrative   Not on file   Social Drivers of Health   Financial Resource Strain: Not on file  Food Insecurity: Not on file  Transportation Needs: Not on file  Physical Activity: Not on file  Stress: Not on file  Social Connections: Not on file    Allergies:  Allergies  Allergen Reactions   Amoxicillin-Pot Clavulanate Rash   Cat Dander Nausea And Vomiting    Metabolic Disorder Labs: Lab Results  Component Value Date   HGBA1C 4.4 (L) 11/23/2017   MPG 79.58 11/23/2017   No results found for: PROLACTIN Lab Results  Component Value Date   CHOL 130 11/23/2017   TRIG 51 11/23/2017   HDL 41 11/23/2017   CHOLHDL 3.2 11/23/2017   VLDL 10 11/23/2017   LDLCALC 79 11/23/2017   Lab Results  Component Value Date   TSH 0.496 11/23/2017    Therapeutic Level Labs: No results found for: LITHIUM No results found for: VALPROATE No results found for: CBMZ  Current Medications: Current Outpatient Medications  Medication Sig Dispense Refill   buPROPion  ER (WELLBUTRIN  SR) 100 MG 12 hr tablet Take 100 mg by mouth 2 (two) times daily.     hydrOXYzine  (ATARAX ) 10 MG tablet Take 1-2 tablets (10-20 mg  total) by mouth 3 (three) times daily as needed for anxiety. 30 tablet 0   lisdexamfetamine (VYVANSE ) 50 MG capsule Take 50 mg by mouth daily.     sertraline  (ZOLOFT ) 100 MG tablet Take 100 mg by mouth every morning.     No current facility-administered medications for this visit.     Musculoskeletal: Strength & Muscle Tone: within normal limits Gait & Station: normal Patient leans: N/A  Psychiatric Specialty Exam: Review of Systems  Constitutional: Negative.   HENT: Negative.    Eyes: Negative.   Respiratory: Negative.    Cardiovascular: Negative.   Gastrointestinal: Negative.   Endocrine: Negative.   Genitourinary: Negative.   Musculoskeletal: Negative.   Allergic/Immunologic: Negative.   Neurological: Negative.   Hematological: Negative.  Psychiatric/Behavioral:  The patient is nervous/anxious.     Blood pressure 126/88, pulse (!) 118, temperature 98.6 F (37 C), temperature source Temporal, height 4' 11 (1.499 m), weight 199 lb (90.3 kg).Body mass index is 40.19 kg/m.  General Appearance: Well Groomed  Eye Contact:  Good  Speech:  Clear and Coherent  Volume:  Normal  Mood:  Euthymic  Affect:  Appropriate  Thought Process:  Coherent  Orientation:  Full (Time, Place, and Person)  Thought Content: WDL   Suicidal Thoughts:  No  Homicidal Thoughts:  No  Memory:  Immediate;   Good Recent;   Good Remote;   Good  Judgement:  Good  Insight:  Good  Psychomotor Activity:  Normal  Concentration:  Concentration: Good and Attention Span: Good  Recall:  Good  Fund of Knowledge: Good  Language: Good  Akathisia:  No  Handed:  Right  AIMS (if indicated):   Assets:  Desire for Improvement Financial Resources/Insurance Housing  ADL's:  Intact  Cognition: WNL  Sleep:  Good   Screenings: AIMS    Flowsheet Row [facility]  AIMS Total Score 0   GAD-7    Flowsheet Row Office Visit from  06/02/2024 in [facility]  Total GAD-7 Score 7   PHQ2-9    Flowsheet Row Office Visit from 06/02/2024 in [facility] Office Visit from 05/13/2024 in [facility]  PHQ-2 Total Score 4 4  PHQ-9 Total Score 14 17   Flowsheet Row Office Visit from 06/02/2024 in [facility] Office Visit from 05/13/2024 in [facility] ED from 05/03/2024 in [facility]  C-SSRS RISK CATEGORY No Risk Low Risk Moderate Risk     Assessment and Plan:  Assessment - Diagnosis: Moderate episode of recurrent major depressive disorder (HCC) [F33.1]  2. Social anxiety disorder [F40.10]    Differential Diagnosis: Bipolar Disorder, pt endorses previous manic-like behavior  - Progress: Patient reports that her medications are going well and she does not state any significant changes since the last 2 weeks. - Risk Factors: Worsening symptoms  Plan - Medications:  Continue Zoloft  100 mg once daily, patient has been educated on the purpose of the medication educated on the nature of the SSRI.  Patient has been educated that this medication does not work right away and takes up to 2 to 4 weeks for it to work and states that any medication changes with SSRIs will take time to feel the therapeutic effects.  Patient has been encouraged to take this medication at the same time every day and consistently. Continue Wellbutrin  ER 100 mg BID daily, patient was started on this medication while at the ER the last visit has been encouraged to continue taking his medication patient endorses feeling better with this medication.  Patient has been educated on the initial nervousness jitteriness as well as headaches with initial dosage of the medication and has been encouraged to call the provider should it  last longer than a week. Continue Hydroxyzine  10-20mg  3 times a day as needed prn.  Patient has been educated on the sedating nature of this medication and encouraged not to take the medication while driving or operating machinery. Continue taking Vyvanse  40mg  once daily for ADHD.  - Psychotherapy: Provided a local therapy resource.  - Education: Patient has been educated on how to  contact her provider through my chart messaging or by calling the clinic.  Patient has been educated on medications, purpose, side effects and adverse reactions. - Follow-Up: Pt will follow-up in 1 month.  - Referrals: [facility] for ADHD Testing - Safety Planning: The patient has been educated, if they should have suicidal thoughts with or without a plan to call 911, or go to the closest emergency department.  Pt verbalized understanding.  Pt denies firearms within the home.  Pt also agrees to call the clinic should they have worsening symptoms before the next appointment.  Patient/Guardian was advised Release of Information must be obtained prior to any record release in order to collaborate their care with an outside provider. Patient/Guardian was advised if they have not already done so to contact the registration department to sign all necessary forms in order for us  to release information regarding their care.   Consent: Patient/Guardian gives verbal consent for treatment and assignment of benefits for services provided during this visit. Patient/Guardian expressed understanding and agreed to proceed.    [name], NP 07/07/2024, 2:32 PM

## 2024-08-05 ENCOUNTER — Other Ambulatory Visit: Payer: Self-pay

## 2024-08-05 ENCOUNTER — Ambulatory Visit (INDEPENDENT_AMBULATORY_CARE_PROVIDER_SITE_OTHER): Admitting: Psychiatry

## 2024-08-05 ENCOUNTER — Encounter: Payer: Self-pay | Admitting: Psychiatry

## 2024-08-05 VITALS — BP 110/78 | HR 99 | Temp 97.4°F | Ht 59.0 in | Wt 209.8 lb

## 2024-08-05 DIAGNOSIS — F331 Major depressive disorder, recurrent, moderate: Secondary | ICD-10-CM

## 2024-08-05 DIAGNOSIS — F4010 Social phobia, unspecified: Secondary | ICD-10-CM | POA: Diagnosis not present

## 2024-08-05 DIAGNOSIS — F900 Attention-deficit hyperactivity disorder, predominantly inattentive type: Secondary | ICD-10-CM

## 2024-08-05 NOTE — Progress Notes (Signed)
 BH MD/PA/NP OP Progress Note  08/05/2024 9:06 AM Cassandra Reed  MRN:  [id number]  Chief Complaint:  Chief Complaint  Patient presents with   Follow-up   HPI: 23 year old female presenting to [facility] for follow-up.  Patient reports that she is encountering new stressors in which she had to be removed from her home based on her mental health stating that her mom was being overwhelming due to her smoking THC.  Patient reports she does not plan on returning back home and states that she is currently living with her friend and is not sure what her next plans are but states that she will continue to stay there until further notice.  Patient reports that she is still working at [employer] but is looking for a second or even a third job in which she is trying to find more employment as her hours have been cut and she is not making as much many as she thought she was making.  Patient reports that she still has plans on trying to save up for car but states that as she is only making $100 a week she is not making enough progress.  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  Patient reports she has applied to [employer] jobs and states that she is looking forward to trying to start another job that is more predictable as well as more income.  Patient reports medications are going appropriately and denies SI and HI, AVH.  Patient reports that she is satisfied with current medication regimen but states that this new stressor of being removed from her home has been very stressful.  Patient has somewhere safe to go and is living with her friend currently.  Based on this assessment interview recommended for the patient to continue medication regimen.  See  plan.  Patient with no other questions or concerns at this time.  Patient is in agreement with treatment plan.  Patient will follow up in 1 month.  Virtual.  I personally spent a total of 30 minutes in the care of the patient today including preparing to see the patient, getting/reviewing separately obtained history, performing a medically appropriate exam/evaluation, and counseling and educating.  Visit Diagnosis:    ICD-10-CM   1. Moderate episode of recurrent major depressive disorder (HCC)  F33.1     2. Social anxiety disorder  F40.10     3. ADHD (attention deficit hyperactivity disorder), inattentive type  F90.0       Past Psychiatric History:  Previous Psych Hospitalizations: - ER visit at [facility] on 05/03/2024 for suicidal ideation -December 2018 patient was admitted at: [facility], for suicidal attempt with ingestion on medications. Outpatient treatment:  - Currently being managed by PCP [name].   Medications Current: - Zoloft  100 mg once daily - Wellbutrin  100 mg extended release twice a day - Hydroxyzine  10 to 20 mg 3 times a day as needed for anxiety - Vyvanse  40 mg daily    Next Steps: - Monitor for potential bipolar disorder   Medication Trials: - Patient reports no recent medication trials Suicide & Violence: - Currently denies SI, HI, AVH -Previous suicide attempt in 2018   Substance Use: - Daily marijuana  user, currently attempting to cut down on marijuana use   Psychotherapy: - Patient is interested in CBT for anxiety, patient has been provided local therapist list.   Legal:  - Denies  Past Medical History: History reviewed. No pertinent past medical history.  Past Surgical History:  Procedure Laterality Date   APPENDECTOMY  09/01/2017   LAPAROSCOPIC APPENDECTOMY N/A 09/01/2017   Procedure: APPENDECTOMY LAPAROSCOPIC;  Surgeon: Chuckie Casimiro KIDD, MD;  Location: [facility] OR;  Service: General;  Laterality: N/A;    Family  Psychiatric History: No additional  Family History:  Family History  Problem Relation Age of Onset   Depression Mother    Hyperlipidemia Mother    Hypertension Mother    Heart disease Father 85       stent placement   Bipolar disorder Maternal Aunt     Social History:  Social History   Socioeconomic History   Marital status: Single    Spouse name: Not on file   Number of children: Not on file   Years of education: Not on file   Highest education level: Some college, no degree  Occupational History   Not on file  Tobacco Use   Smoking status: Never   Smokeless tobacco: Never  Vaping Use   Vaping status: Every Day   Substances: THC  Substance and Sexual Activity   Alcohol use: No   Drug use: No   Sexual activity: Not Currently    Birth control/protection: Abstinence  Other Topics Concern   Not on file  Social History Narrative   Not on file   Social Drivers of Health   Financial Resource Strain: Not on file  Food Insecurity: Not on file  Transportation Needs: Not on file  Physical Activity: Not on file  Stress: Not on file  Social Connections: Not on file    Allergies:  Allergies  Allergen Reactions   Amoxicillin-Pot Clavulanate Rash   Cat Dander Nausea And Vomiting    Metabolic Disorder Labs: Lab Results  Component Value Date   HGBA1C 4.4 (L) 11/23/2017   MPG 79.58 11/23/2017   No results found for: PROLACTIN Lab Results  Component Value Date   CHOL 130 11/23/2017   TRIG 51 11/23/2017   HDL 41 11/23/2017   CHOLHDL 3.2 11/23/2017   VLDL 10 11/23/2017   LDLCALC 79 11/23/2017   Lab Results  Component Value Date   TSH 0.496 11/23/2017    Therapeutic Level Labs: No results found for: LITHIUM No results found for: VALPROATE No results found for: CBMZ  Current Medications: Current Outpatient Medications  Medication Sig Dispense Refill   buPROPion  ER (WELLBUTRIN  SR) 100 MG 12 hr tablet Take 1 tablet (100 mg total) by mouth 2 (two)  times daily. 60 tablet 2   hydrOXYzine  (ATARAX ) 10 MG tablet Take 1-2 tablets (10-20 mg total) by mouth 3 (three) times daily as needed for anxiety. 30 tablet 1   lisdexamfetamine (VYVANSE ) 40 MG capsule Take 1 capsule (40 mg total) by mouth every morning. 30 capsule 0   sertraline  (ZOLOFT ) 100 MG tablet Take 1 tablet (100 mg total) by mouth every morning. 30 tablet 2   No current facility-administered medications for this visit.     Musculoskeletal: Strength & Muscle Tone: within normal limits Gait & Station: normal Patient leans: N/A  Psychiatric Specialty Exam: Review of Systems  Constitutional: Negative.   HENT: Negative.    Eyes: Negative.   Respiratory: Negative.    Cardiovascular: Negative.   Gastrointestinal: Negative.  Endocrine: Negative.   Genitourinary: Negative.   Musculoskeletal: Negative.   Skin: Negative.   Allergic/Immunologic: Negative.   Neurological: Negative.   Hematological: Negative.   Psychiatric/Behavioral:  The patient is nervous/anxious.     Blood pressure 110/78, pulse 99, temperature (!) 97.4 F (36.3 C), temperature source Temporal, height 4' 11 (1.499 m), weight 209 lb 12.8 oz (95.2 kg).Body mass index is 42.37 kg/m.  General Appearance: Well Groomed  Eye Contact:  Good  Speech:  Clear and Coherent  Volume:  Normal  Mood:  Anxious  Affect:  Appropriate  Thought Process:  Coherent  Orientation:  Full (Time, Place, and Person)  Thought Content: Logical   Suicidal Thoughts:  No  Homicidal Thoughts:  No  Memory:  Immediate;   Good Recent;   Good Remote;   Good  Judgement:  Good  Insight:  Good  Psychomotor Activity:  Normal  Concentration:  Concentration: Good and Attention Span: Good  Recall:  Good  Fund of Knowledge: Good  Language: Good  Akathisia:  No  Handed:  Right  AIMS (if indicated):   Assets:  Desire for Improvement Financial Resources/Insurance Housing  ADL's:  Intact  Cognition: WNL  Sleep:  Good    Screenings: AIMS    Flowsheet Row [facility]  AIMS Total Score 0   GAD-7    Flowsheet Row Office Visit from 07/07/2024 in [facility] Office Visit from 06/02/2024 in [facility]  Total GAD-7 Score 8 7   PHQ2-9    Flowsheet Row Office Visit from 07/07/2024 in [facility] Office Visit from 06/02/2024 in [facility] Office Visit from 05/13/2024 in [facility]  PHQ-2 Total Score 1 4 4   PHQ-9 Total Score 7 14 17    Flowsheet Row Office Visit from 06/02/2024 in [facility] Office Visit from 05/13/2024 in [facility] ED from 05/03/2024 in [facility] Department at [facility]  C-SSRS RISK CATEGORY No Risk Low Risk Moderate Risk     Assessment and Plan:  Assessment - Diagnosis: Moderate episode of recurrent major depressive disorder (HCC) [F33.1]  2. Social anxiety disorder [F40.10]    Differential Diagnosis: Bipolar Disorder, pt endorses previous manic-like behavior  - Progress: Patient reports that her medications are going well and she does not state any significant changes since the last 2 weeks. - Risk Factors: Worsening symptoms  Plan - Medications:  Continue Zoloft  100 mg once daily, patient has been educated on the purpose of the medication educated on the nature of the SSRI.  Patient has been educated that this medication does not work right away and takes up to 2 to 4 weeks for it to work and states that any medication changes with SSRIs will take time to feel the therapeutic effects.  Patient has been encouraged to take this medication at the same time every day and consistently. Continue Wellbutrin  ER 100  mg BID daily, patient was started on this medication while at the ER the last visit has been encouraged to continue taking his medication patient endorses feeling better with this medication.  Patient has been educated on the initial nervousness jitteriness as well as headaches with initial dosage of the medication and has been encouraged to call the provider should it last longer than a week. Continue Hydroxyzine  10-20mg  3 times  a day as needed prn.  Patient has been educated on the sedating nature of this medication and encouraged not to take the medication while driving or operating machinery. Continue taking Vyvanse  40mg  once daily for ADHD.  - Psychotherapy: Provided a local therapy resource.  - Education: Patient has been educated on how to contact her provider through my chart messaging or by calling the clinic.  Patient has been educated on medications, purpose, side effects and adverse reactions. - Follow-Up: Pt will follow-up in 1 month.  - Referrals: [facility] for ADHD Testing - Safety Planning: The patient has been educated, if they should have suicidal thoughts with or without a plan to call 911, or go to the closest emergency department.  Pt verbalized understanding.  Pt denies firearms within the home.  Pt also agrees to call the clinic should they have worsening symptoms before the next appointment.  Patient/Guardian was advised Release of Information must be obtained prior to any record release in order to collaborate their care with an outside provider. Patient/Guardian was advised if they have not already done so to contact the registration department to sign all necessary forms in order for us  to release information regarding their care.   Consent: Patient/Guardian gives verbal consent for treatment and assignment of benefits for services provided during this visit. Patient/Guardian expressed understanding and agreed to proceed.    [name], NP 08/05/2024, 9:06  AM

## 2024-09-10 ENCOUNTER — Telehealth: Admitting: Psychiatry

## 2024-09-10 DIAGNOSIS — F4010 Social phobia, unspecified: Secondary | ICD-10-CM

## 2024-09-10 DIAGNOSIS — F331 Major depressive disorder, recurrent, moderate: Secondary | ICD-10-CM

## 2024-09-10 DIAGNOSIS — F900 Attention-deficit hyperactivity disorder, predominantly inattentive type: Secondary | ICD-10-CM | POA: Diagnosis not present

## 2024-09-10 MED ORDER — BUPROPION HCL ER (SR) 100 MG PO TB12: 100.0000 mg | ORAL_TABLET | Freq: Two times a day (BID) | ORAL | 2 refills | Status: DC

## 2024-09-10 MED ORDER — LISDEXAMFETAMINE DIMESYLATE 40 MG PO CAPS
40.0000 mg | ORAL_CAPSULE | ORAL | 0 refills | Status: AC
Start: 2024-09-10 — End: ?

## 2024-09-10 MED ORDER — SERTRALINE HCL 100 MG PO TABS
100.0000 mg | ORAL_TABLET | Freq: Every morning | ORAL | 2 refills | Status: AC
Start: 2024-09-10 — End: ?

## 2024-09-10 MED ORDER — LISDEXAMFETAMINE DIMESYLATE 40 MG PO CAPS: 40.0000 mg | ORAL_CAPSULE | Freq: Every day | ORAL | 0 refills | Status: DC

## 2024-09-10 MED ORDER — HYDROXYZINE HCL 10 MG PO TABS: 10.0000 mg | ORAL_TABLET | Freq: Three times a day (TID) | ORAL | 1 refills | Status: DC | PRN

## 2024-09-10 NOTE — Progress Notes (Addendum)
 BH MD/PA/NP OP Progress Note  09/10/2024 9:02 AM [name]  MRN:  [id number]  Chief Complaint: Routine followup  Virtual Visit via Video Note  I connected with [name] Brought on 09/14/24 at  9:00 AM EDT by a video enabled telemedicine application and verified that I am speaking with the correct person using two identifiers.  Location: Patient: Cassandra Reed in [place] Provider: [facility] Office   I discussed the limitations of evaluation and management by telemedicine and the availability of in person appointments. The patient expressed understanding and agreed to proceed.    I discussed the assessment and treatment plan with the patient. The patient was provided an opportunity to ask questions and all were answered. The patient agreed with the plan and demonstrated an understanding of the instructions.   The patient was advised to call back or seek an in-person evaluation if the symptoms worsen or if the condition fails to improve as anticipated.  I provided 30 minutes of non-face-to-face time during this encounter.   [name], NP    HPI: 23 year old female presenting ARPA for follow-up.  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  Patient reports that her goal is to just try to find another job and to continue making money so she [place] to save for a car and to be able to pursue her treatment going back to [employer].  Patient denies SI, HI, AVH.  Patient reports that the medications have been doing very well and that she has been feeling the best she has felt since being admitted to the hospital.  Patient reports that currently she is working on getting her license renewed.  Patient with no other questions or concerns at this time.  Patient will continue medication per the medication regimen.  See plan.  Patient is in agreement with treatment plan.   Based on patient's stability on medications patient is will do a maintenance follow-up in 3 months. Visit Diagnosis:    ICD-10-CM   1. Moderate episode of recurrent major depressive disorder (HCC)  F33.1 sertraline  (ZOLOFT ) 100 MG tablet    buPROPion  ER (WELLBUTRIN  SR) 100 MG 12 hr tablet    2. ADHD (attention deficit hyperactivity disorder), inattentive type  F90.0 lisdexamfetamine (VYVANSE ) 40 MG capsule    3. Social anxiety disorder  F40.10 hydrOXYzine  (ATARAX ) 10 MG tablet      Past Psychiatric History:  Previous Psych Hospitalizations: - ER visit at [facility] on 05/03/2024 for suicidal ideation -December 2018 patient was admitted at: [facility], for suicidal attempt with ingestion on medications. Outpatient treatment:  - Currently being managed by PCP [name].   Medications Current: - Zoloft  100 mg once daily - Wellbutrin  100 mg extended release twice a day - Hydroxyzine  10 to 20 mg 3 times a day as needed for anxiety - Vyvanse  40 mg daily    Next Steps: - Monitor for potential bipolar disorder   Medication Trials: - Patient reports no recent medication trials Suicide & Violence: - Currently denies SI, HI, AVH -Previous suicide attempt in 2018   Substance Use: - Daily marijuana user, currently attempting to cut down on marijuana use   Psychotherapy: - Patient is interested in CBT for anxiety, patient has been provided local therapist list.   Legal:  - Denies  Past Medical History: No past medical history on file.  Past  Surgical History:  Procedure Laterality Date   APPENDECTOMY  09/01/2017   LAPAROSCOPIC APPENDECTOMY N/A 09/01/2017   Procedure: APPENDECTOMY LAPAROSCOPIC;  Surgeon: Chuckie Casimiro KIDD, MD;  Location: [facility] OR;  Service: General;  Laterality: N/A;    Family Psychiatric History:  No additional  Family History:  Family History  Problem Relation Age of Onset   Depression Mother    Hyperlipidemia Mother    Hypertension Mother     Heart disease Father 79       stent placement   Bipolar disorder Maternal Aunt     Social History:  Social History   Socioeconomic History   Marital status: Single    Spouse name: Not on file   Number of children: Not on file   Years of education: Not on file   Highest education level: Some college, no degree  Occupational History   Not on file  Tobacco Use   Smoking status: Never   Smokeless tobacco: Never  Vaping Use   Vaping status: Every Day   Substances: THC  Substance and Sexual Activity   Alcohol use: No   Drug use: No   Sexual activity: Not Currently    Birth control/protection: Abstinence  Other Topics Concern   Not on file  Social History Narrative   Not on file   Social Drivers of Health   Financial Resource Strain: Not on file  Food Insecurity: Not on file  Transportation Needs: Not on file  Physical Activity: Not on file  Stress: Not on file  Social Connections: Not on file    Allergies:  Allergies  Allergen Reactions   Amoxicillin-Pot Clavulanate Rash   Cat Dander Nausea And Vomiting    Metabolic Disorder Labs: Lab Results  Component Value Date   HGBA1C 4.4 (L) 11/23/2017   MPG 79.58 11/23/2017   No results found for: PROLACTIN Lab Results  Component Value Date   CHOL 130 11/23/2017   TRIG 51 11/23/2017   HDL 41 11/23/2017   CHOLHDL 3.2 11/23/2017   VLDL 10 11/23/2017   LDLCALC 79 11/23/2017   Lab Results  Component Value Date   TSH 0.496 11/23/2017    Therapeutic Level Labs: No results found for: LITHIUM No results found for: VALPROATE No results found for: CBMZ  Current Medications: Current Outpatient Medications  Medication Sig Dispense Refill   buPROPion  ER (WELLBUTRIN  SR) 100 MG 12 hr tablet Take 1 tablet (100 mg total) by mouth 2 (two) times daily. 60 tablet 2   hydrOXYzine  (ATARAX ) 10 MG tablet Take 1-2 tablets (10-20 mg total) by mouth 3 (three) times daily as needed for anxiety. 30 tablet 1    lisdexamfetamine (VYVANSE ) 40 MG capsule Take 1 capsule (40 mg total) by mouth every morning. 30 capsule 0   sertraline  (ZOLOFT ) 100 MG tablet Take 1 tablet (100 mg total) by mouth every morning. 30 tablet 2   No current facility-administered medications for this visit.     Musculoskeletal: Strength & Muscle Tone: within normal limits Gait & Station: normal Patient leans: N/A   Psychiatric Specialty Exam: Review of Systems  Constitutional: Negative.   HENT: Negative.    Eyes: Negative.   Respiratory: Negative.    Cardiovascular: Negative.   Gastrointestinal: Negative.   Endocrine: Negative.   Genitourinary: Negative.   Musculoskeletal: Negative.   Skin: Negative.   Allergic/Immunologic: Negative.   Neurological: Negative.   Hematological: Negative.   Psychiatric/Behavioral:  The patient is nervous/anxious.     No vitals for virtual visit.  General Appearance: Well Groomed  Eye Contact:  Good  Speech:  Clear and Coherent  Volume:  Normal  Mood:  Anxious  Affect:  Appropriate  Thought Process:  Coherent  Orientation:  Full (Time, Place, and Person)  Thought Content: Logical   Suicidal Thoughts:  No  Homicidal Thoughts:  No  Memory:  Immediate;   Good Recent;   Good Remote;   Good  Judgement:  Good  Insight:  Good  Psychomotor Activity:  Normal  Concentration:  Concentration: Good and Attention Span: Good  Recall:  Good  Fund of Knowledge: Good  Language: Good  Akathisia:  No  Handed:  Right  AIMS (if indicated):   Assets:  Desire for Improvement Financial Resources/Insurance Housing  ADL's:  Intact  Cognition: WNL  Sleep:  Good   Screenings: AIMS    Flowsheet Row [facility]  AIMS Total Score 0   GAD-7    Flowsheet Row Office Visit from 08/05/2024 in [facility] Office Visit from 07/07/2024 in [facility] Office Visit from 06/02/2024 in [facility]  Total GAD-7 Score 7 8 7    PHQ2-9    Flowsheet Row Office Visit from 08/05/2024 in [facility] Office Visit from 07/07/2024 in [facility] Office Visit from 06/02/2024 in [facility] Office Visit from 05/13/2024 in [facility]  PHQ-2 Total Score 1 1 4 4   PHQ-9 Total Score 4 7 14 17    Flowsheet Row Office Visit from 06/02/2024 in [facility] Office Visit from 05/13/2024 in [facility] ED from 05/03/2024 in [facility] Emergency Department at [facility]  C-SSRS RISK CATEGORY No Risk Low Risk Moderate Risk     Assessment and Plan:  Assessment - Diagnosis: Moderate episode of recurrent major depressive disorder (HCC) [F33.1]  2. Social anxiety disorder [F40.10]    Differential Diagnosis: Bipolar Disorder, pt endorses previous manic-like behavior  - Progress: Patient reports that her medications are going well and she does not state any significant changes since the last 2 weeks. - Risk Factors: Worsening symptoms  Plan - Medications:  Continue Zoloft  100 mg once daily, patient has been educated on the purpose of the medication educated on the nature of the SSRI.  Patient has been educated that this medication does not work right away and takes up to 2 to 4 weeks for it to work and states that any medication changes with SSRIs will take time to feel the therapeutic effects.  Patient has been encouraged to take this medication at the same time every day and consistently. Continue Wellbutrin  ER 100 mg BID daily, patient was started on this medication while at the ER the last visit has been encouraged to continue taking his medication patient endorses feeling  better with this medication.  Patient has been educated on the initial nervousness jitteriness as well as headaches with initial dosage of the medication and has been encouraged to call the provider should it last longer than a week. Continue Hydroxyzine  10-20mg  3 times a day as needed prn.  Patient has been educated on the sedating nature of this medication and encouraged not to take the medication while driving or operating machinery. Continue taking Vyvanse  40mg  once daily for ADHD.  - Psychotherapy: Provided a local therapy resource.  -  Education: Patient has been educated on how to contact her provider through my chart messaging or by calling the clinic.  Patient has been educated on medications, purpose, side effects and adverse reactions. - Follow-Up: Pt will follow-up in 1 month.  - Referrals: [facility] for ADHD Testing - Safety Planning: The patient has been educated, if they should have suicidal thoughts with or without a plan to call 911, or go to the closest emergency department.  Pt verbalized understanding.  Pt denies firearms within the home.  Pt also agrees to call the clinic should they have worsening symptoms before the next appointment.  Patient/Guardian was advised Release of Information must be obtained prior to any record release in order to collaborate their care with an outside provider. Patient/Guardian was advised if they have not already done so to contact the registration department to sign all necessary forms in order for us  to release information regarding their care.   Consent: Patient/Guardian gives verbal consent for treatment and assignment of benefits for services provided during this visit. Patient/Guardian expressed understanding and agreed to proceed.    [name], NP 09/10/2024, 9:02 AM

## 2024-11-26 ENCOUNTER — Telehealth: Admitting: Psychiatry

## 2024-11-26 DIAGNOSIS — F331 Major depressive disorder, recurrent, moderate: Secondary | ICD-10-CM | POA: Diagnosis not present

## 2024-11-26 DIAGNOSIS — F4010 Social phobia, unspecified: Secondary | ICD-10-CM

## 2024-11-26 MED ORDER — HYDROXYZINE HCL 10 MG PO TABS: 10.0000 mg | ORAL_TABLET | Freq: Three times a day (TID) | ORAL | 1 refills | Status: AC | PRN

## 2024-11-26 MED ORDER — SERTRALINE HCL 100 MG PO TABS: 100.0000 mg | ORAL_TABLET | Freq: Every morning | ORAL | 2 refills | Status: AC

## 2024-11-26 MED ORDER — LISDEXAMFETAMINE DIMESYLATE 20 MG PO CAPS: 20.0000 mg | ORAL_CAPSULE | Freq: Every day | ORAL | 0 refills | Status: AC

## 2024-11-26 NOTE — Progress Notes (Unsigned)
 BH MD/PA/NP OP Progress Note  11/26/2024 1:02 PM Cassandra Reed  MRN:  [id number]  Chief Complaint: Routine follow-up Virtual Visit via Video Note  I connected with Cassandra Reed on 11/26/2024 at  1:00 PM EST by a video enabled telemedicine application and verified that I am speaking with the correct person using two identifiers.  Location: Patient: [address] [place], [address] Provider: [facility]   I discussed the limitations of evaluation and management by telemedicine and the availability of in person appointments. The patient expressed understanding and agreed to proceed.    I discussed the assessment and treatment plan with the patient. The patient was provided an opportunity to ask questions and all were answered. The patient agreed with the plan and demonstrated an understanding of the instructions.   The patient was advised to call back or seek an in-person evaluation if the symptoms worsen or if the condition fails to improve as anticipated.  I provided 30 minutes of non-face-to-face time during this encounter.   Cassandra Cassandra Der, NP   HPI: 23 year old female presenting ARPA for follow-up.  Patient reports that she is back living with her mom but states that she is still waiting patiently for a new job as since she is still waiting for them to let them know when she can start.  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  Patient reports she is experiencing a significant amount of stress and states that she has had suicidal ideation within a previous several weeks.  Patient reports is highly related to high stressors and reports that this is often dependent on how stress she has.   Patient reports that her current medication regimen, she was not compliant with and stopped roughly a month ago.  Patient reports she has not taken any medication since it has become overly sensitive to the ADHD medications.  Based on this assessment interview is recommended for the patient to start sertraline  at 50 mg once daily, and then start back Vyvanse  at 20 mg once daily for tolerance.  Patient will continue taking hydroxyzine  10 to 20 mg to read up to 3 times a day as needed for anxiety.  Patient currently denies SI, HI, AVH.  Patient with no other question concerns at this time.  Patient is in agreement with treatment plan.  Patient with no follow-up at this time.  Patient will be following this provider to the next practice. Visit Diagnosis:    ICD-10-CM   1. Moderate episode of recurrent major depressive disorder (HCC)  F33.1 sertraline  (ZOLOFT ) 100 MG tablet    2. Social anxiety disorder  F40.10 hydrOXYzine  (ATARAX ) 10 MG tablet      Past Psychiatric History:  Previous Psych Hospitalizations: - ER visit at [facility] on 05/03/2024 for suicidal ideation -December 2018 patient was admitted at: [facility], for suicidal attempt with ingestion on medications. Outpatient treatment:  - Currently being managed by PCP Cassandra hind MD.   Medications Current: - Zoloft  100 mg once daily - Wellbutrin  100 mg extended release twice a day - Hydroxyzine  10 to 20 mg 3 times a day as needed for anxiety - Vyvanse  40 mg daily  Next Steps: - Monitor for potential bipolar disorder   Medication Trials: - Patient reports no recent medication trials Suicide & Violence: - Currently denies SI, HI, AVH -Previous suicide attempt in 2018   Substance Use: - Daily marijuana user, currently attempting to cut down on marijuana use   Psychotherapy: - Patient is interested in CBT for anxiety, patient has been provided local therapist list.   Legal:  - Denies  Past Medical History: No  past medical history on file.  Past Surgical History:  Procedure Laterality Date   APPENDECTOMY  09/01/2017   LAPAROSCOPIC APPENDECTOMY N/A 09/01/2017   Procedure: APPENDECTOMY LAPAROSCOPIC;  Surgeon: Cassandra Casimiro KIDD, MD;  Location: [facility] OR;  Service: General;  Laterality: N/A;    Family Psychiatric History: No additional  Family History:  Family History  Problem Relation Age of Onset   Depression Mother    Hyperlipidemia Mother    Hypertension Mother    Heart disease Father 39       stent placement   Bipolar disorder Maternal Aunt     Social History:  Social History   Socioeconomic History   Marital status: Single    Spouse name: Not on file   Number of children: Not on file   Years of education: Not on file   Highest education level: Some college, no degree  Occupational History   Not on file  Tobacco Use   Smoking status: Never   Smokeless tobacco: Never  Vaping Use   Vaping status: Every Day   Substances: THC  Substance and Sexual Activity   Alcohol use: No   Drug use: No   Sexual activity: Not Currently    Birth control/protection: Abstinence  Other Topics Concern   Not on file  Social History Narrative   Not on file   Social Drivers of Health   Tobacco Use: Low Risk (08/05/2024)   Patient History    Smoking Tobacco Use: Never    Smokeless Tobacco Use: Never    Passive Exposure: Not on file  Financial Resource Strain: Not on file  Food Insecurity: Not on file  Transportation Needs: Not on file  Physical Activity: Not on file  Stress: Not on file  Social Connections: Not on file  Depression (EYV7-0): Low Risk (08/05/2024)   Depression (PHQ2-9)    PHQ-2 Score: 4  Recent Concern: Depression (PHQ2-9) - Medium Risk (07/07/2024)   Depression (PHQ2-9)    PHQ-2 Score: 7  Alcohol Screen: Not on file  Housing: Not on file  Utilities: Not on file  Health Literacy: Not on file    Allergies: Allergies[1]  Metabolic Disorder Labs: Lab Results  Component  Value Date   HGBA1C 4.4 (L) 11/23/2017   MPG 79.58 11/23/2017   No results found for: PROLACTIN Lab Results  Component Value Date   CHOL 130 11/23/2017   TRIG 51 11/23/2017   HDL 41 11/23/2017   CHOLHDL 3.2 11/23/2017   VLDL 10 11/23/2017   LDLCALC 79 11/23/2017   Lab Results  Component Value Date   TSH 0.496 11/23/2017    Therapeutic Level Labs: No results found for: LITHIUM No results found for: VALPROATE No results found for: CBMZ  Current Medications: Current Outpatient Medications  Medication Sig Dispense Refill   buPROPion  ER (WELLBUTRIN  SR) 100 MG 12 hr tablet Take 1 tablet (100 mg total) by mouth 2 (two) times daily. 60 tablet 2   hydrOXYzine  (ATARAX ) 10 MG tablet Take 1-2 tablets (10-20 mg total) by mouth 3 (three) times  daily as needed for anxiety. 30 tablet 1   lisdexamfetamine (VYVANSE ) 40 MG capsule Take 1 capsule (40 mg total) by mouth every morning. 30 capsule 0   lisdexamfetamine (VYVANSE ) 40 MG capsule Take 1 capsule (40 mg total) by mouth daily. 30 capsule 0   lisdexamfetamine (VYVANSE ) 40 MG capsule Take 1 capsule (40 mg total) by mouth daily. 30 capsule 0   sertraline  (ZOLOFT ) 100 MG tablet Take 1 tablet (100 mg total) by mouth every morning. 30 tablet 2   No current facility-administered medications for this visit.     Musculoskeletal: Strength & Muscle Tone: within normal limits Gait & Station: normal Patient leans: N/A   Psychiatric Specialty Exam: Review of Systems  Constitutional: Negative.   HENT: Negative.    Eyes: Negative.   Respiratory: Negative.    Cardiovascular: Negative.   Gastrointestinal: Negative.   Endocrine: Negative.   Genitourinary: Negative.   Musculoskeletal: Negative.   Skin: Negative.   Allergic/Immunologic: Negative.   Neurological: Negative.   Hematological: Negative.   Psychiatric/Behavioral:  The patient is nervous/anxious.      No vitals for virtual visit.  General Appearance: Well Groomed  Eye  Contact:  Good  Speech:  Clear and Coherent  Volume:  Normal  Mood:  Anxious  Affect:  Appropriate  Thought Process:  Coherent  Orientation:  Full (Time, Place, and Person)  Thought Content: Logical   Suicidal Thoughts:  No  Homicidal Thoughts:  No  Memory:  Immediate;   Good Recent;   Good Remote;   Good  Judgement:  Good  Insight:  Good  Psychomotor Activity:  Normal  Concentration:  Concentration: Good and Attention Span: Good  Recall:  Good  Fund of Knowledge: Good  Language: Good  Akathisia:  No  Handed:  Right  AIMS (if indicated):   Assets:  Desire for Improvement Financial Resources/Insurance Housing  ADL's:  Intact  Cognition: WNL  Sleep:  Good   Screenings: AIMS    Flowsheet Row [facility]  AIMS Total Score 0   GAD-7    Flowsheet Row Office Visit from 08/05/2024 in [facility] Office Visit from 07/07/2024 in [facility] Office Visit from 06/02/2024 in [facility]  Total GAD-7 Score 7 8 7    PHQ2-9    Flowsheet Row Office Visit from 08/05/2024 in [facility] Office Visit from 07/07/2024 in [facility] Office Visit from 06/02/2024 in [facility] Office Visit from 05/13/2024 in [facility]  PHQ-2 Total Score 1 1 4 4   PHQ-9 Total Score 4 7 14 17    Flowsheet Row Office Visit from 06/02/2024 in [facility] Office Visit from 05/13/2024 in [facility] ED from 05/03/2024 in [facility] Emergency Department at [facility]  C-SSRS RISK CATEGORY No Risk Low Risk Moderate Risk     Assessment and Plan:  Assessment -  Diagnosis: Moderate episode of recurrent major depressive disorder (HCC) [F33.1]  2. Social anxiety disorder [F40.10]    Differential Diagnosis: Bipolar Disorder, pt endorses previous manic-like behavior  - Progress: Patient reports that her medications are going well and she does not state any significant changes since the last 2 weeks. - Risk Factors: Worsening symptoms  Plan - Medications:  Decrease Zoloft  50 mg once daily, patient  has been educated on the purpose of the medication educated on the nature of the SSRI.  Patient has been educated that this medication does not work right away and takes up to 2 to 4 weeks for it to work and states that any medication changes with SSRIs will take time to feel the therapeutic effects.  Patient has been encouraged to take this medication at the same time every day and consistently. Stop Wellbutrin  Continue Hydroxyzine  10-20mg  3 times a day as needed prn.  Patient has been educated on the sedating nature of this medication and encouraged not to take the medication while driving or operating machinery. Decrease taking Vyvanse  20mg  once daily for ADHD.  - Psychotherapy: Provided a local therapy resource.  - Education: Patient has been educated on how to contact her provider through my chart messaging or by calling the clinic.  Patient has been educated on medications, purpose, side effects and adverse reactions. - Follow-Up: Pt will follow-up in 1 month.  - Referrals: [facility] for ADHD Testing - Safety Planning: The patient has been educated, if they should have suicidal thoughts with or without a plan to call 911, or go to the closest emergency department.  Pt verbalized understanding.  Pt denies firearms within the home.  Pt also agrees to call the clinic should they have worsening symptoms before the next appointment.   Patient/Guardian was advised Release of Information must be obtained prior to any record release in order to collaborate  their care with an outside provider. Patient/Guardian was advised if they have not already done so to contact the registration department to sign all necessary forms in order for us  to release information regarding their care.   Consent: Patient/Guardian gives verbal consent for treatment and assignment of benefits for services provided during this visit. Patient/Guardian expressed understanding and agreed to proceed.    Cassandra Cassandra Der, NP 11/26/2024, 1:02 PM     [1]  Allergies Allergen Reactions   Amoxicillin-Pot Clavulanate Rash   Cat Dander Nausea And Vomiting
